# Patient Record
Sex: Female | Born: 1975 | Race: White | Hispanic: No | Marital: Married | Smoking: Never smoker
Health system: Southern US, Community
[De-identification: ages and names within clinical notes are randomized; demographics above are authoritative.]

## PROBLEM LIST (undated history)

## (undated) DIAGNOSIS — F419 Anxiety disorder, unspecified: Secondary | ICD-10-CM

---
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
## 1998-05-31 ENCOUNTER — Ambulatory Visit (HOSPITAL_COMMUNITY): Admission: RE | Admit: 1998-05-31 | Discharge: 1998-05-31 | Payer: Self-pay | Admitting: Internal Medicine

## 1998-05-31 ENCOUNTER — Encounter: Payer: Self-pay | Admitting: Internal Medicine

## 2003-08-14 ENCOUNTER — Emergency Department (HOSPITAL_COMMUNITY): Admission: EM | Admit: 2003-08-14 | Discharge: 2003-08-14 | Payer: Self-pay | Admitting: Emergency Medicine

## 2007-12-21 ENCOUNTER — Other Ambulatory Visit: Admission: RE | Admit: 2007-12-21 | Discharge: 2007-12-21 | Payer: Self-pay | Admitting: Obstetrics and Gynecology

## 2011-10-17 ENCOUNTER — Other Ambulatory Visit (HOSPITAL_COMMUNITY)
Admission: RE | Admit: 2011-10-17 | Discharge: 2011-10-17 | Disposition: A | Discharge status: Home / Self Care | Payer: BC Managed Care – PPO | Source: Ambulatory Visit | Attending: Family Medicine | Admitting: Family Medicine

## 2011-10-17 ENCOUNTER — Other Ambulatory Visit: Payer: Self-pay | Admitting: Family Medicine

## 2011-10-17 DIAGNOSIS — R87810 Cervical high risk human papillomavirus (HPV) DNA test positive: Secondary | ICD-10-CM | POA: Insufficient documentation

## 2011-10-17 DIAGNOSIS — Z01419 Encounter for gynecological examination (general) (routine) without abnormal findings: Secondary | ICD-10-CM | POA: Insufficient documentation

## 2011-10-30 ENCOUNTER — Other Ambulatory Visit: Payer: Self-pay | Admitting: Family Medicine

## 2012-04-27 ENCOUNTER — Other Ambulatory Visit (HOSPITAL_COMMUNITY)
Admission: RE | Admit: 2012-04-27 | Discharge: 2012-04-27 | Disposition: A | Discharge status: Home / Self Care | Payer: BC Managed Care – PPO | Source: Ambulatory Visit | Attending: Family Medicine | Admitting: Family Medicine

## 2012-04-27 ENCOUNTER — Other Ambulatory Visit: Payer: Self-pay | Admitting: Family Medicine

## 2012-04-27 DIAGNOSIS — R87619 Unspecified abnormal cytological findings in specimens from cervix uteri: Secondary | ICD-10-CM | POA: Insufficient documentation

## 2012-11-13 ENCOUNTER — Other Ambulatory Visit (HOSPITAL_COMMUNITY)
Admission: RE | Admit: 2012-11-13 | Discharge: 2012-11-13 | Disposition: A | Discharge status: Home / Self Care | Payer: BC Managed Care – PPO | Source: Ambulatory Visit | Attending: Family Medicine | Admitting: Family Medicine

## 2012-11-13 ENCOUNTER — Other Ambulatory Visit: Payer: Self-pay | Admitting: Physician Assistant

## 2012-11-13 DIAGNOSIS — Z01419 Encounter for gynecological examination (general) (routine) without abnormal findings: Secondary | ICD-10-CM | POA: Insufficient documentation

## 2013-02-08 ENCOUNTER — Telehealth: Payer: Self-pay | Admitting: Nurse Practitioner

## 2013-02-09 NOTE — Telephone Encounter (Signed)
error 

## 2019-05-26 ENCOUNTER — Other Ambulatory Visit: Payer: Self-pay

## 2019-05-26 ENCOUNTER — Emergency Department (HOSPITAL_COMMUNITY)
Admission: EM | Admit: 2019-05-26 | Discharge: 2019-05-26 | Discharge status: Against Medical Advice | Payer: BC Managed Care – PPO | Attending: Emergency Medicine | Admitting: Emergency Medicine

## 2019-05-26 ENCOUNTER — Encounter (HOSPITAL_COMMUNITY): Payer: Self-pay | Admitting: Family Medicine

## 2019-05-26 DIAGNOSIS — R109 Unspecified abdominal pain: Secondary | ICD-10-CM | POA: Diagnosis present

## 2019-05-26 DIAGNOSIS — Z5321 Procedure and treatment not carried out due to patient leaving prior to being seen by health care provider: Secondary | ICD-10-CM | POA: Diagnosis not present

## 2019-05-26 LAB — COMPREHENSIVE METABOLIC PANEL
ALT: 29 U/L (ref 0–44)
AST: 23 U/L (ref 15–41)
Albumin: 4.6 g/dL (ref 3.5–5.0)
Alkaline Phosphatase: 78 U/L (ref 38–126)
Anion gap: 10 (ref 5–15)
BUN: 18 mg/dL (ref 6–20)
CO2: 24 mmol/L (ref 22–32)
Calcium: 9.5 mg/dL (ref 8.9–10.3)
Chloride: 104 mmol/L (ref 98–111)
Creatinine, Ser: 0.86 mg/dL (ref 0.44–1.00)
GFR calc Af Amer: >60 mL/min (ref >60)
GFR calc non Af Amer: >60 mL/min (ref >60)
Glucose, Bld: 124 mg/dL — ABNORMAL HIGH (ref 70–99)
Potassium: 4.2 mmol/L (ref 3.5–5.1)
Sodium: 138 mmol/L (ref 135–145)
Total Bilirubin: 0.2 mg/dL — ABNORMAL LOW (ref 0.3–1.2)
Total Protein: 7.6 g/dL (ref 6.5–8.1)

## 2019-05-26 LAB — LIPASE, BLOOD: Lipase: 37 U/L (ref 11–51)

## 2019-05-26 LAB — URINALYSIS, ROUTINE W REFLEX MICROSCOPIC
Bilirubin Urine: NEGATIVE
Glucose, UA: NEGATIVE mg/dL
Hgb urine dipstick: NEGATIVE
Ketones, ur: NEGATIVE mg/dL
Leukocytes,Ua: NEGATIVE
Nitrite: NEGATIVE
Protein, ur: NEGATIVE mg/dL
Specific Gravity, Urine: 1.02 (ref 1.005–1.030)
pH: 5 (ref 5.0–8.0)

## 2019-05-26 LAB — CBC
HCT: 45.5 % (ref 36.0–46.0)
Hemoglobin: 14.4 g/dL (ref 12.0–15.0)
MCH: 27.7 pg (ref 26.0–34.0)
MCHC: 31.6 g/dL (ref 30.0–36.0)
MCV: 87.7 fL (ref 80.0–100.0)
Platelets: 287 10*3/uL (ref 150–400)
RBC: 5.19 MIL/uL — ABNORMAL HIGH (ref 3.87–5.11)
RDW: 12.2 % (ref 11.5–15.5)
WBC: 10.9 10*3/uL — ABNORMAL HIGH (ref 4.0–10.5)
nRBC: 0 % (ref 0.0–0.2)

## 2019-05-26 LAB — I-STAT BETA HCG BLOOD, ED (MC, WL, AP ONLY): I-stat hCG, quantitative: <5 m[IU]/mL (ref <5)

## 2019-05-26 NOTE — ED Notes (Signed)
Patient informed registration that she was leaving due to the wait.

## 2019-10-12 ENCOUNTER — Ambulatory Visit: Payer: BC Managed Care – PPO | Admitting: Plastic Surgery

## 2019-10-12 ENCOUNTER — Encounter: Payer: Self-pay | Admitting: Plastic Surgery

## 2019-10-12 ENCOUNTER — Other Ambulatory Visit: Payer: Self-pay

## 2019-10-12 DIAGNOSIS — M542 Cervicalgia: Secondary | ICD-10-CM

## 2019-10-12 DIAGNOSIS — M549 Dorsalgia, unspecified: Secondary | ICD-10-CM | POA: Insufficient documentation

## 2019-10-12 DIAGNOSIS — N62 Hypertrophy of breast: Secondary | ICD-10-CM | POA: Diagnosis not present

## 2019-10-12 DIAGNOSIS — M546 Pain in thoracic spine: Secondary | ICD-10-CM | POA: Diagnosis not present

## 2019-10-12 DIAGNOSIS — G8929 Other chronic pain: Secondary | ICD-10-CM | POA: Diagnosis not present

## 2019-10-12 NOTE — Progress Notes (Signed)
Patient ID: Sierra Flores, female    DOB: 1976/08/10, 44 y.o.   MRN: [id number]   Chief Complaint  Patient presents with  . Breast Problem    Mammary Hyperplasia: The patient is a 44 y.o. female with a history of mammary hyperplasia for several years.  She has extremely large breasts causing symptoms that include the following: Back pain in the upper and lower back, including neck pain. She pulls or pins her bra straps to provide better lift and relief of the pressure and pain. She notices relief by holding her breast up manually.  Her shoulder straps cause grooves and pain and pressure that requires padding for relief. Pain medication is sometimes required with motrin and tylenol.  Activities that are hindered by enlarged breasts include: exercise and running. She has not been to PT yet but is willing.  She has been seen by her PCP for the neck and back pain. She is a social smoker of M and is willing to obstaine before surgery.  Her breasts are extremely large and fairly symmetric. The right is slightly larger than the left side. She has hyperpigmentation of the inframammary area on both sides.  The sternal to nipple distance on the right is 32 cm and the left is 31 cm.  The IMF distance is 18 cm.  She is 5 feet 5 inches tall and weighs 220 pounds.  Preoperative bra size = 42 DD/DDD cup.  The estimated excess breast tissue to be removed at the time of surgery = 750 grams on the left and 750 grams on the right.  Mammogram history: 2/20 and was negative.  She is planing to have this years mammogram in the next 1-2 weeks.    Review of Systems  Constitutional: Negative.  Negative for activity change.  HENT: Negative.   Eyes: Negative.   Respiratory: Negative.   Cardiovascular: Negative.  Negative for leg swelling.  Gastrointestinal: Negative.   Endocrine: Negative.   Genitourinary: Negative.   Musculoskeletal: Positive for back pain and neck pain.  Skin: Negative for color change and  wound.  Neurological: Negative.   Hematological: Negative.   Psychiatric/Behavioral: Negative.     History reviewed. No pertinent past medical history.  History reviewed. No pertinent surgical history.    Current Outpatient Medications:  .  escitalopram (LEXAPRO) 10 MG tablet, Take 10 mg by mouth daily., Disp: , Rfl:  .  rizatriptan (MAXALT) 10 MG tablet, Take 10 mg by mouth 2 (two) times daily as needed., Disp: , Rfl:    Objective:   Vitals:   10/12/19 1117  BP: 134/76  Pulse: 70  Temp: 98.2 F (36.8 C)  SpO2: 99%    Physical Exam Vitals and nursing note reviewed.  Constitutional:      Appearance: Normal appearance.  HENT:     Head: Normocephalic and atraumatic.  Cardiovascular:     Rate and Rhythm: Normal rate.  Pulmonary:     Effort: Pulmonary effort is normal. No respiratory distress.  Abdominal:     General: Abdomen is flat. There is no distension.  Skin:    General: Skin is warm.  Neurological:     General: No focal deficit present.     Mental Status: She is alert and oriented to person, place, and time.  Psychiatric:        Mood and Affect: Mood normal.        Behavior: Behavior normal.     Assessment & Plan:  Neck pain  Chronic bilateral thoracic back pain  Symptomatic mammary hypertrophy  Recommend bilateral breast reduction with possible liposuction.  Recommend PT for eval and treatment.  Will need updated mammogram.  Pictures were obtained of the patient and placed in the chart with the patient's or guardian's permission.   Peggye Form, DO   The 21st Century Cures Act was signed into law in 2016 which includes the topic of electronic health records.  This provides immediate access to information in MyChart.  This includes consultation notes, operative notes, office notes, lab results and pathology reports.  If you have any questions about what you read please let us know at your next visit or call us at the office.  We are right here  with you.

## 2019-10-26 ENCOUNTER — Ambulatory Visit: Payer: BC Managed Care – PPO | Attending: Plastic Surgery | Admitting: Physical Therapy

## 2019-10-26 ENCOUNTER — Other Ambulatory Visit: Payer: Self-pay

## 2019-10-26 ENCOUNTER — Encounter: Payer: Self-pay | Admitting: Physical Therapy

## 2019-10-26 DIAGNOSIS — M6281 Muscle weakness (generalized): Secondary | ICD-10-CM | POA: Diagnosis present

## 2019-10-26 DIAGNOSIS — M546 Pain in thoracic spine: Secondary | ICD-10-CM | POA: Diagnosis present

## 2019-10-26 DIAGNOSIS — R293 Abnormal posture: Secondary | ICD-10-CM | POA: Diagnosis present

## 2019-10-26 DIAGNOSIS — M542 Cervicalgia: Secondary | ICD-10-CM | POA: Diagnosis present

## 2019-10-26 DIAGNOSIS — M5441 Lumbago with sciatica, right side: Secondary | ICD-10-CM | POA: Diagnosis present

## 2019-10-26 NOTE — Patient Instructions (Addendum)
Access Code: [email or web address]  URL: [email or web address]  Date: 10/26/2019  Prepared by: Garen Lah   Exercises  Seated Piriformis Stretch - 10 reps - 3 sets - 1x daily - 7x weekly  Shoulder External Rotation and Scapular Retraction with Resistance - 10 reps - 3 sets - 2x daily - 7x weekly  Scapular Retraction with Resistance - 10 reps - 3 sets - 2x daily - 7x weekly  Scapular Retraction with Resistance Advanced - 10 reps - 3 sets - 2x daily - 7x weekly    Garen Lah, PT Certified Exercise Expert for the Aging Adult  10/26/19 11:36 AM Phone: [phone] Fax: [phone]

## 2019-10-26 NOTE — Therapy (Signed)
[facility] [address] [place], [place], [address] Phone: [phone]   Fax:  [phone]  Physical Therapy Evaluation  Patient Details  Name: Caileen Veracruz MRN: [id number] Date of Birth: [birth year] Referring Provider (PT): Foster Simpson MD   Encounter Date: 10/26/2019  PT End of Session - 10/26/19 1449    Visit Number  1    Number of Visits  12    Date for PT Re-Evaluation  12/07/19    Authorization Type  BCBS    PT Start Time  1100    PT Stop Time  1144    PT Time Calculation (min)  44 min    Activity Tolerance  Patient tolerated treatment well    Behavior During Therapy  [facility] for tasks assessed/performed       History reviewed. No pertinent past medical history.  History reviewed. No pertinent surgical history.  There were no vitals filed for this visit.   Subjective Assessment - 10/26/19 1109    Subjective  My pain is in my mid and low back. I work at [employer] at [employer].    Pertinent History  back and neck pain more recent.    How long can you sit comfortably?  30 min    Patient Stated Goals  Get stronger and not have back pain,    Currently in Pain?  Yes    Pain Score  7     Pain Location  Thoracic    Pain Orientation  Mid;Right;Left    Pain Descriptors / Indicators  Burning;Sharp    Pain Type  Chronic pain    Pain Onset  More than a month ago    Aggravating Factors   bending over, household chores, unloading dish washer, taking care of dogs    Multiple Pain Sites  Yes    Pain Score  7    Pain Location  Back    Pain Orientation  Right    Pain Descriptors / Indicators  Aching;Nagging    Pain Radiating Towards  Rt buttocks    Aggravating Factors   bending over to pick up items    Pain Score  0    Pain Location  Neck    Pain Orientation  Right;Left         [facility] PT Assessment - 10/26/19 0001      Assessment   Medical Diagnosis  thoracic and low back pain, neck pain ,  symptomatick breast hypertrophy    Referring Provider (PT)  Foster Simpson MD    Onset Date/Surgical Date  07/28/19   I have had pain last year but increasing last 3 months   Hand Dominance  Left    Next MD Visit  none scheduled    Prior Therapy  none      Precautions   Precautions  None      Restrictions   Weight Bearing Restrictions  No      Balance Screen   Has the patient fallen in the past 6 months  No    Has the patient had a decrease in activity level because of a fear of falling?   No    Is the patient reluctant to leave their home because of a fear of falling?   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     Cognition   Overall Cognitive Status  Within Functional Limits for tasks assessed      Observation/Other Assessments   Focus on Therapeutic Outcomes (FOTO)   FOTO intake for Thoracic intak 52% limitation 48% predicted 33%,  neck intake 58% limitation 42%l predicted 21%       Sensation   Light Touch  Appears Intact      Posture/Postural Control   Posture/Postural Control  Postural limitations    Postural Limitations  Rounded Shoulders;Forward head;Anterior pelvic tilt    Posture Comments  increased abdominal girth      ROM / Strength   AROM / PROM / Strength  AROM;Strength      AROM   Overall AROM   Deficits    Right Shoulder Flexion  160 Degrees    Right Shoulder ABduction  157 Degrees    Left Shoulder Flexion  160 Degrees    Left Shoulder ABduction  156 Degrees    Right Hip External Rotation   20   painful   Right Hip Internal Rotation   30    Left Hip External Rotation   46    Left  Hip Internal Rotation   35    Cervical Flexion  WNL    Cervical Extension  WNL    Cervical - Right Side Bend  25    Cervical - Left Side Bend  35    Cervical - Right Rotation  65    Cervical - Left Rotation  65    Lumbar Flexion  70   finger tips to ankles   Lumbar Extension  15    Lumbar - Right Side Bend  WNL    Lumbar - Left Side Bend  WNL    Lumbar - Right Rotation  WNL    Lumbar - Left Rotation  WNL      Strength   Overall Strength  Deficits    Right Shoulder Flexion  4+/5    Right Shoulder Extension  4+/5    Right Shoulder ABduction  4+/5    Right Shoulder Internal Rotation  4+/5    Right Shoulder External Rotation  4/5    Left Shoulder Flexion  4+/5    Left Shoulder Extension  4+/5    Left Shoulder ABduction  4+/5    Left Shoulder Internal Rotation  4+/5    Left Shoulder External Rotation  4/5    Right Hip Flexion  5/5    Right Hip Extension  5/5    Right Hip ABduction  4/5    Left Hip Flexion  5/5    Left Hip Extension  5/5    Left Hip ABduction  4/5    Right Knee Flexion  5/5    Right Knee Extension  5/5    Left Knee Flexion  5/5    Left Knee Extension  5/5      Flexibility   Soft Tissue Assessment /Muscle Length  yes    Hamstrings  RT and LT 65      Palpation   Palpation comment  tenderness over RT low back Lumbar/ QL/ RT hip gluteals /piriformis  and tenderness over RT/LT thoracic paraspinals, Tnederness over bil upper trap/ levator, mild tenderness over bil cervical paraspinals                Objective measurements completed on examination: See above findings.      [facility] Adult PT Treatment/Exercise - 10/26/19 0001      Lumbar Exercises: Stretches   Piriformis Stretch  3 reps;30 seconds    Piriformis Stretch Limitations  seated for RT LE      Shoulder Exercises: Standing   External Rotation  Strengthening;Both;10 reps;Theraband    Theraband Level (Shoulder External Rotation)  Level 2 (Red)    Extension  Strengthening;Both;10  reps;Theraband    Theraband Level (Shoulder Extension)  Level 2 (Red)    Row  Strengthening;Both;10 reps;Theraband    Theraband Level (Shoulder Row)  Level 2 (Red)               PT Short Term Goals - 10/26/19 1427      PT SHORT TERM GOAL #1   Title  STG=LTG        PT Long Term Goals - 10/26/19 1139      PT LONG TERM GOAL #1   Title  Pt will be independent with advanced HEP for neck, core and back/LE    Time  6    Period  Weeks    Status  New    Target Date  12/07/19      PT LONG TERM GOAL #2   Title  Pt will be able to bend forward to pick up items in order to care for dogs without exacerbating pain    Time  6    Period  Weeks    Status  New    Target Date  12/07/19      PT LONG TERM GOAL #3   Title  Pt will be able to set up work station for computer /seated work for full time job to encourage better posture for working tasks    Time  6    Period  Weeks    Status  New    Target Date  12/07/19      PT LONG TERM GOAL #4   Title  Pt will be able to demonstrate proper posture and pain management techniques in order to unload dish washer and perform other household chores    Time  6    Period  Weeks    Status  New    Target Date  12/07/19      PT LONG TERM GOAL #5   Title  FOTO will improve from  48% limitation of thoracic spine, 42% limiation of neck    to  to 33% limitation of Thoracic and 21% liimitaiton of neck   indicating improved functional mobility.    Time  6    Period  Weeks    Status  New    Target Date  12/07/19            Access Code: [email or web address]  URL: [email or web address]  Date: 10/26/2019  Prepared by: Garen Lah   Exercises  Seated Piriformis Stretch - 10 reps - 3 sets - 1x daily - 7x weekly  Shoulder External Rotation and Scapular Retraction with Resistance - 10 reps - 3 sets - 2x daily - 7x weekly  Scapular Retraction with Resistance - 10 reps - 3 sets - 2x daily - 7x weekly  Scapular Retraction with  Resistance Advanced - 10 reps - 3 sets - 2x  daily - 7x weekly   Plan - 10/26/19 1437    Clinical Impression Statement  44 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  she also has symptomatic breast hypertrophy and abnormal posture in which to compensate.  She is unable to bend down and care for dogs or work comfortably at full time job, and additional part time in retail. Household chores and increasing pain in last 3 months encouraged her to try to find relief.  Ms Liddicoat will benefit form skilled PT to address impairments in abnormal posture, decrease AROM, muscle weakness ,pain due to enlarged breast hypertrophy encouraging a more flexed posture.    Examination-Activity Limitations  Bend;Sit    Stability/Clinical Decision Making  Stable/Uncomplicated    Clinical Decision Making  Low    Rehab Potential  Good    PT Frequency  2x / week    PT Duration  6 weeks    PT Treatment/Interventions  Cryotherapy;Electrical Stimulation;Iontophoresis 4mg /ml Dexamethasone;Moist Heat;Traction;Ultrasound;Therapeutic exercise;Therapeutic activities;Neuromuscular re-education;Patient/family education;Passive range of motion;Manual techniques;Dry needling;Taping;Joint Manipulations;Spinal Manipulations    PT Next Visit Plan  reinforce HEP and thoracic. back/ neck strength    PT Home Exercise Plan  [email or web address]    Consulted and Agree with Plan of Care  Patient       Patient will benefit from skilled therapeutic intervention in order to improve the following deficits and impairments:  Pain, Improper body mechanics, Postural dysfunction, Decreased strength, Decreased range of motion, Decreased activity tolerance, Increased muscle spasms  Visit Diagnosis: Pain in thoracic spine  Acute right-sided low back pain with right-sided sciatica  Cervicalgia  Abnormal posture  Muscle weakness (generalized)     Problem List Patient Active Problem List    Diagnosis Date Noted  . Neck pain 10/12/2019  . Back pain 10/12/2019  . Symptomatic mammary hypertrophy 10/12/2019    Voncille Lo, PT Certified Exercise Expert for the Aging Adult  10/26/19 2:50 PM Phone: [phone] Fax: [facility] [address] [place], [place], [address] Phone: [phone]   Fax:  [phone]  Name: Etoy Mcdonnell MRN: [id number] Date of Birth: [birth year]

## 2019-11-04 ENCOUNTER — Ambulatory Visit: Payer: BC Managed Care – PPO | Admitting: Physical Therapy

## 2019-11-04 ENCOUNTER — Other Ambulatory Visit: Payer: Self-pay

## 2019-11-04 DIAGNOSIS — M5441 Lumbago with sciatica, right side: Secondary | ICD-10-CM

## 2019-11-04 DIAGNOSIS — R293 Abnormal posture: Secondary | ICD-10-CM

## 2019-11-04 DIAGNOSIS — M546 Pain in thoracic spine: Secondary | ICD-10-CM | POA: Diagnosis not present

## 2019-11-04 DIAGNOSIS — M6281 Muscle weakness (generalized): Secondary | ICD-10-CM

## 2019-11-04 DIAGNOSIS — M542 Cervicalgia: Secondary | ICD-10-CM

## 2019-11-04 NOTE — Therapy (Addendum)
[place], [place], [address] Phone: [phone]   Fax:  [phone]  Physical Therapy Treatment  Patient Details  Name: Sierra Flores MRN: [id number] Date of Birth: July 22, 1976 Referring Provider (PT): Audelia Hives MD   Encounter Date: 11/04/2019  PT End of Session - 11/04/19 1237    Visit Number  2    Number of Visits  12    Date for PT Re-Evaluation  12/07/19    Authorization Type  BCBS    PT Start Time  1230    PT Stop Time  1315    PT Time Calculation (min)  45 min       No past medical history on file.  No past surgical history on file.  There were no vitals filed for this visit.  Subjective Assessment - 11/04/19 1233    Subjective  No pain at rest . Right low back hurts when bending over. Also did not mention right heel burns with walking. Sometimes stiff ankle in the morning.    Currently in Pain?  No/denies    Pain Score  --   no pain at rest                      [facility] Adult PT Treatment/Exercise - 11/04/19 0001      Lumbar Exercises: Stretches   Piriformis Stretch  3 reps;30 seconds    Piriformis Stretch Limitations      push and pull   Gastroc Stretch  3 reps;30 seconds      Lumbar Exercises: Aerobic   Nustep  L4 UE/LE x 5 minutes       Lumbar Exercises: Supine   Clam  20 reps    Clam Limitations  green     Bridge  10 reps    Bridge with clamshell  20 reps    Bridge with [employer] Limitations  Green      Lumbar Exercises: Quadruped   Single Arm Raise  10 reps    Single Arm Raises Limitations  cues for spine alignement / chin tuck    Straight Leg Raise  10 reps    Opposite Arm/Leg Raise  10 reps      Shoulder Exercises: Standing   External Rotation  Strengthening;Both;10 reps;Theraband    Theraband Level (Shoulder External Rotation)  Level 2 (Red)    Extension  20 reps    Theraband Level (Shoulder Extension)  Level 2 (Red)    Row   Strengthening;Both;10 reps;Theraband    Theraband Level (Shoulder Row)  Level 2 (Red)      Neck Exercises: Stretches   Upper Trapezius Stretch  2 reps;20 seconds    Levator Stretch  2 reps;20 seconds    Other Neck Stretches  neck retraction x10              PT Education - 11/04/19 1426    Education Details  HEP    Person(s) Educated  Patient    Methods  Explanation;Handout    Comprehension  Verbalized understanding       PT Short Term Goals - 10/26/19 1427      PT SHORT TERM GOAL #1   Title  STG=LTG        PT Long Term Goals - 10/26/19 1139      PT LONG TERM GOAL #1   Title  Pt will be independent with advanced HEP for neck, core and back/LE    Time  6  Period  Weeks    Status  New    Target Date  12/07/19      PT LONG TERM GOAL #2   Title  Pt will be able to bend forward to pick up items in order to care for dogs without exacerbating pain    Time  6    Period  Weeks    Status  New    Target Date  12/07/19      PT LONG TERM GOAL #3   Title  Pt will be able to set up work station for computer /seated work for full time job to encourage better posture for working tasks    Time  6    Period  Weeks    Status  New    Target Date  12/07/19      PT LONG TERM GOAL #4   Title  Pt will be able to demonstrate proper posture and pain management techniques in order to unload dish washer and perform other household chores    Time  6    Period  Weeks    Status  New    Target Date  12/07/19      PT LONG TERM GOAL #5   Title  FOTO will improve from  48% limitation of thoracic spine, 42% limiation of neck    to  to 33% limitation of Thoracic and 21% liimitaiton of neck   indicating improved functional mobility.    Time  6    Period  Weeks    Status  New    Target Date  12/07/19            Plan - 11/04/19 1237    Clinical Impression Statement  Pt reports no pain with HEP and no pain at rest. She reports improvement in upper back and neck with HEP however  still has the same pain in right low back with bending over. Reviewed HEP and progressed with core and hip strength with cervical stabilization. Added claf stretch for ankle tightness. She had no pain during session.    PT Next Visit Plan  reinforce HEP and thoracic. back/ neck strength,    PT Home Exercise Plan  [email or web address] ( shoulder rows, extension, bilateral shoulder ER, seated figure 4) added bird dogs and bridge with clam       Patient will benefit from skilled therapeutic intervention in order to improve the following deficits and impairments:  Pain, Improper body mechanics, Postural dysfunction, Decreased strength, Decreased range of motion, Decreased activity tolerance, Increased muscle spasms  Visit Diagnosis: Pain in thoracic spine  Acute right-sided low back pain with right-sided sciatica  Cervicalgia  Abnormal posture  Muscle weakness (generalized)     Problem List Patient Active Problem List   Diagnosis Date Noted  . Neck pain 10/12/2019  . Back pain 10/12/2019  . Symptomatic mammary hypertrophy 10/12/2019    Sherrie Mustache, PTA 11/04/2019, 2:26 PM  [facility] [address] [place], [place], [address] Phone: [phone]   Fax:  [phone]  Name: Bobbiejo Ishikawa MRN: [id number] Date of Birth: November 21, 1975

## 2019-11-04 NOTE — Patient Instructions (Signed)
Access Code: [email or web address] URL: [email or web address] Date: 11/04/2019 Prepared by: Jannette Spanner  Exercises Bird Dog - 2 x daily - 7 x weekly - 2 sets - 10 reps Bridge with Hip Abduction and Resistance - 2 x daily - 7 x weekly - 2 sets - 10 reps Standing Gastroc Stretch - 2 x daily - 7 x weekly - 1 sets - 3 reps - 30 hold

## 2019-11-08 ENCOUNTER — Encounter: Payer: Self-pay | Admitting: Physical Therapy

## 2019-11-08 ENCOUNTER — Other Ambulatory Visit: Payer: Self-pay

## 2019-11-08 ENCOUNTER — Ambulatory Visit: Payer: BC Managed Care – PPO | Admitting: Physical Therapy

## 2019-11-08 DIAGNOSIS — M6281 Muscle weakness (generalized): Secondary | ICD-10-CM

## 2019-11-08 DIAGNOSIS — M5441 Lumbago with sciatica, right side: Secondary | ICD-10-CM

## 2019-11-08 DIAGNOSIS — M546 Pain in thoracic spine: Secondary | ICD-10-CM | POA: Diagnosis not present

## 2019-11-08 DIAGNOSIS — R293 Abnormal posture: Secondary | ICD-10-CM

## 2019-11-08 DIAGNOSIS — M542 Cervicalgia: Secondary | ICD-10-CM

## 2019-11-08 NOTE — Patient Instructions (Signed)
Standing Hip Hinge with Dowel - 2 x daily - 7 x weekly - 2 sets - 10 reps

## 2019-11-08 NOTE — Therapy (Signed)
[place], [place], [address] Phone: [phone]   Fax:  [phone]  Physical Therapy Treatment  Patient Details  Name: Sierra Flores MRN: [id number] Date of Birth: Jun 22, 1976 Referring Provider (PT): Audelia Hives MD   Encounter Date: 11/08/2019  PT End of Session - 11/08/19 1153    Visit Number  3    Number of Visits  12    Date for PT Re-Evaluation  12/07/19    Authorization Type  BCBS    PT Start Time  6222    PT Stop Time  1230    PT Time Calculation (min)  45 min       History reviewed. No pertinent past medical history.  History reviewed. No pertinent surgical history.  There were no vitals filed for this visit.  Subjective Assessment - 11/08/19 1151    Subjective  3/10 my back is bothering me. Heel is still burning with walking.    Currently in Pain?  Yes    Pain Score  3     Pain Location  Back    Pain Orientation  Right;Left;Mid    Pain Descriptors / Indicators  Burning    Pain Type  Chronic pain                       [facility] Adult PT Treatment/Exercise - 11/08/19 0001      Lumbar Exercises: Stretches   Piriformis Stretch  3 reps;30 seconds    Piriformis Stretch Limitations      push and pull   Other Lumbar Stretch Exercise  childs pose       Lumbar Exercises: Standing   Lifting  20 reps    Lifting Limitations  15# from 8 inch step     Other Standing Lumbar Exercises  hip hinging with dowel , multiple reps       Lumbar Exercises: Quadruped   Madcat/Old Horse  10 reps    Opposite Arm/Leg Raise  10 reps      Shoulder Exercises: Standing   External Rotation  Strengthening;Both;10 reps;Theraband    Theraband Level (Shoulder External Rotation)  Level 2 (Red)    Extension  20 reps    Theraband Level (Shoulder Extension)  Level 3 (Green)    Row  Strengthening;Both;10 reps;Theraband    Theraband Level (Shoulder Row)  Level 3 Nyoka Cowden)              PT Education - 11/08/19 1233    Education Details  HEP    Person(s) Educated  Patient    Methods  Explanation;Handout    Comprehension  Verbalized understanding       PT Short Term Goals - 10/26/19 1427      PT SHORT TERM GOAL #1   Title  STG=LTG        PT Long Term Goals - 10/26/19 1139      PT LONG TERM GOAL #1   Title  Pt will be independent with advanced HEP for neck, core and back/LE    Time  6    Period  Weeks    Status  New    Target Date  12/07/19      PT LONG TERM GOAL #2   Title  Pt will be able to bend forward to pick up items in order to care for dogs without exacerbating pain    Time  6    Period  Weeks    Status  New  Target Date  12/07/19      PT LONG TERM GOAL #3   Title  Pt will be able to set up work station for computer /seated work for full time job to encourage better posture for working tasks    Time  6    Period  Weeks    Status  New    Target Date  12/07/19      PT LONG TERM GOAL #4   Title  Pt will be able to demonstrate proper posture and pain management techniques in order to unload dish washer and perform other household chores    Time  6    Period  Weeks    Status  New    Target Date  12/07/19      PT LONG TERM GOAL #5   Title  FOTO will improve from  48% limitation of thoracic spine, 42% limiation of neck    to  to 33% limitation of Thoracic and 21% liimitaiton of neck   indicating improved functional mobility.    Time  6    Period  Weeks    Status  New    Target Date  12/07/19            Plan - 11/08/19 1232    Clinical Impression Statement  Pt reports no increased pain or soreness after session. Her pain is in her lower back mostly and is exacerbated with lifting and bending activities. Reviewed HEP and began hip hinge/ dead lift training. She needed constant verbal and tactile cues with wooden dowel to perform proper hip hinge. Added hip hinge with dowel to HEP. She did not have pain during session  today.,    PT Next Visit Plan  reinforce HEP and thoracic. back/ neck strength, continue lifting progression  as tolerated , reinforce hip hinge form    PT Home Exercise Plan  [id number] ( shoulder rows, extension, bilateral shoulder ER, seated figure 4) added bird dogs and bridge with clam, added hip hinge with dowel       Patient will benefit from skilled therapeutic intervention in order to improve the following deficits and impairments:  Pain, Improper body mechanics, Postural dysfunction, Decreased strength, Decreased range of motion, Decreased activity tolerance, Increased muscle spasms  Visit Diagnosis: Pain in thoracic spine  Acute right-sided low back pain with right-sided sciatica  Cervicalgia  Abnormal posture  Muscle weakness (generalized)     Problem List Patient Active Problem List   Diagnosis Date Noted  . Neck pain 10/12/2019  . Back pain 10/12/2019  . 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 [place], [place], [address] Phone: [phone]   Fax:  [phone]  Name: Sierra Flores MRN: [id number] Date of Birth: 07-12-1976

## 2019-11-10 ENCOUNTER — Encounter: Payer: Self-pay | Admitting: Physical Therapy

## 2019-11-10 ENCOUNTER — Other Ambulatory Visit: Payer: Self-pay

## 2019-11-10 ENCOUNTER — Ambulatory Visit: Payer: BC Managed Care – PPO | Admitting: Physical Therapy

## 2019-11-10 DIAGNOSIS — R293 Abnormal posture: Secondary | ICD-10-CM

## 2019-11-10 DIAGNOSIS — M6281 Muscle weakness (generalized): Secondary | ICD-10-CM

## 2019-11-10 DIAGNOSIS — M546 Pain in thoracic spine: Secondary | ICD-10-CM

## 2019-11-10 DIAGNOSIS — M542 Cervicalgia: Secondary | ICD-10-CM

## 2019-11-10 DIAGNOSIS — M5441 Lumbago with sciatica, right side: Secondary | ICD-10-CM

## 2019-11-10 NOTE — Therapy (Addendum)
[place], [place], [address] Phone: [phone]   Fax:  [phone]  Physical Therapy Treatment/Discharge Note  Patient Details  Name: Sierra Flores MRN: [id number] Date of Birth: [birth year] Referring Provider (PT): Audelia Hives MD   Encounter Date: 11/10/2019  PT End of Session - 11/10/19 1158    Visit Number  4    Number of Visits  12    Date for PT Re-Evaluation  12/07/19    Authorization Type  BCBS    PT Start Time  1155    PT Stop Time  1233    PT Time Calculation (min)  38 min       History reviewed. No pertinent past medical history.  History reviewed. No pertinent surgical history.  There were no vitals filed for this visit.  Subjective Assessment - 11/10/19 1157    Subjective  Burning at heel with walking that comes and goes.    Currently in Pain?  Yes    Pain Score  3     Pain Location  Back    Pain Orientation  Mid;Right    Pain Descriptors / Indicators  Burning    Aggravating Factors   bending four household chores, picking up dogs    Pain Relieving Factors  good body mechanics    Pain Score  0    Pain Location  Back    Pain Orientation  Upper    Pain Score  0    Pain Location  Neck                       [facility] Adult PT Treatment/Exercise - 11/10/19 0001      Lumbar Exercises: Stretches   Other Lumbar Stretch Exercise  childs pose , added lateral stretch       Lumbar Exercises: Standing   Lifting  20 reps   x 2    Lifting Weights (lbs)  15, 20      Lumbar Exercises: Supine   Bent Knee Raise Limitations  90/90 ab bracing wiith table top holds 10 sec, then with toe taps     Bridge  10 reps    Bridge with clamshell  20 reps    Bridge with [employer] Limitations  Green      Lumbar Exercises: Quadruped   Opposite Arm/Leg Raise  10 reps    Opposite Arm/Leg Raise Limitations  cues for chin tuck- reported some left lumbar tension      Shoulder  Exercises: Standing   External Rotation  Strengthening;Both;10 reps;Theraband    Theraband Level (Shoulder External Rotation)  Level 3 (Green)    Extension  20 reps    Theraband Level (Shoulder Extension)  Level 3 (Green)    Row  Strengthening;Both;10 reps;Theraband    Theraband Level (Shoulder Row)  Level 3 Nyoka Cowden)               PT Short Term Goals - 10/26/19 1427      PT SHORT TERM GOAL #1   Title  STG=LTG        PT Long Term Goals - 10/26/19 1139      PT LONG TERM GOAL #1   Title  Pt will be independent with advanced HEP for neck, core and back/LE    Time  6    Period  Weeks    Status  New    Target Date  12/07/19      PT LONG TERM  GOAL #2   Title  Pt will be able to bend forward to pick up items in order to care for dogs without exacerbating pain    Time  6    Period  Weeks    Status  New    Target Date  12/07/19      PT LONG TERM GOAL #3   Title  Pt will be able to set up work station for computer /seated work for full time job to encourage better posture for working tasks    Time  6    Period  Weeks    Status  New    Target Date  12/07/19      PT LONG TERM GOAL #4   Title  Pt will be able to demonstrate proper posture and pain management techniques in order to unload dish washer and perform other household chores    Time  6    Period  Weeks    Status  New    Target Date  12/07/19      PT LONG TERM GOAL #5   Title  FOTO will improve from  48% limitation of thoracic spine, 42% limiation of neck    to  to 33% limitation of Thoracic and 21% liimitaiton of neck   indicating improved functional mobility.    Time  6    Period  Weeks    Status  New    Target Date  12/07/19            Plan - 11/10/19 1232    Clinical Impression Statement  Pt reports her ankle burning is still intermittent with walking. No neck or upper back recently however still low level back pain with bending for ADLS. Her technique with hip hinge/ dead lift is much improved  without cues today. Able to progress with 20# (2 10# dumbells). She had some increased tightness in lumbar during bird dogs today. Able to add lower abdominal strength to HEP without increased pain. No increased pain at end of session.    PT Next Visit Plan  PT : assess burning in heel? reinforce HEP and thoracic. back/ neck strength, continue lifting progression  as tolerated , reinforce hip hinge form, review 90/90 ab bracing    PT Home Exercise Plan  [id number] ( shoulder rows, extension, bilateral shoulder ER, seated figure 4) added bird dogs and bridge with clam, added hip hinge with dowel/ added 90/90 ab bracing.       Patient will benefit from skilled therapeutic intervention in order to improve the following deficits and impairments:  Pain, Improper body mechanics, Postural dysfunction, Decreased strength, Decreased range of motion, Decreased activity tolerance, Increased muscle spasms  Visit Diagnosis: Pain in thoracic spine  Acute right-sided low back pain with right-sided sciatica  Cervicalgia  Abnormal posture  Muscle weakness (generalized)     Problem List Patient Active Problem List   Diagnosis Date Noted  . Neck pain 10/12/2019  . Back pain 10/12/2019  . 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 Patient is being discharged due to the patient's request.  ?????    Pt requested cancellations of all appt due to  going to a different facility  Voncille Lo, PT Certified Exercise Expert for the Aging Adult  11/18/19 10:39 AM Phone: [phone] Fax: [phone]

## 2019-11-16 ENCOUNTER — Ambulatory Visit: Payer: BC Managed Care – PPO | Admitting: Physical Therapy

## 2019-11-18 ENCOUNTER — Ambulatory Visit: Payer: BC Managed Care – PPO | Admitting: Physical Therapy

## 2019-11-23 ENCOUNTER — Encounter: Payer: BC Managed Care – PPO | Admitting: Physical Therapy

## 2019-11-26 ENCOUNTER — Encounter: Payer: BC Managed Care – PPO | Admitting: Physical Therapy

## 2019-11-30 ENCOUNTER — Encounter: Payer: BC Managed Care – PPO | Admitting: Physical Therapy

## 2019-12-02 ENCOUNTER — Encounter: Payer: BC Managed Care – PPO | Admitting: Physical Therapy

## 2019-12-07 ENCOUNTER — Encounter: Payer: BC Managed Care – PPO | Admitting: Physical Therapy

## 2019-12-09 ENCOUNTER — Encounter: Payer: BC Managed Care – PPO | Admitting: Physical Therapy

## 2020-01-27 ENCOUNTER — Institutional Professional Consult (permissible substitution): Payer: BC Managed Care – PPO | Admitting: Plastic Surgery

## 2020-02-02 ENCOUNTER — Encounter: Payer: Self-pay | Admitting: Plastic Surgery

## 2020-02-02 ENCOUNTER — Other Ambulatory Visit: Payer: Self-pay

## 2020-02-02 ENCOUNTER — Ambulatory Visit (INDEPENDENT_AMBULATORY_CARE_PROVIDER_SITE_OTHER): Payer: BC Managed Care – PPO | Admitting: Plastic Surgery

## 2020-02-02 VITALS — BP 132/84 | HR 79 | Ht 66.0 in | Wt 219.6 lb

## 2020-02-02 DIAGNOSIS — M546 Pain in thoracic spine: Secondary | ICD-10-CM

## 2020-02-02 DIAGNOSIS — N62 Hypertrophy of breast: Secondary | ICD-10-CM | POA: Diagnosis not present

## 2020-02-02 DIAGNOSIS — M545 Low back pain, unspecified: Secondary | ICD-10-CM

## 2020-02-02 DIAGNOSIS — M4004 Postural kyphosis, thoracic region: Secondary | ICD-10-CM

## 2020-02-02 NOTE — Progress Notes (Signed)
   Referring Provider Laurann Montana, MD [phone] [place],  [place] [address]   CC:  Chief Complaint  Patient presents with  . Consult      Sierra Flores is an 44 y.o. female.  HPI: Patient presents to discuss breast reduction.  She is had years of back pain, neck pain, shoulder grooving.  She has a rashes beneath her breast.  She has gone to physical therapy and that did not relieve her symptoms.  She had a maternal grandmother with breast cancer.  She is currently a 54 double D and wants to be around a C cup.  She does not smoke cigarettes, is not a diabetic, and is not planning on having anymore children.  For her pain she has tried warm packs, cold packs, over-the-counter medication with little relief.  No Known Allergies  Outpatient Encounter Medications as of 02/02/2020  Medication Sig  . escitalopram (LEXAPRO) 10 MG tablet Take 10 mg by mouth daily.  . rizatriptan (MAXALT) 10 MG tablet Take 10 mg by mouth 2 (two) times daily as needed.   No facility-administered encounter medications on file as of 02/02/2020.     No past medical history on file.  No past surgical history on file.  No family history on file.  Social History   Social History Narrative  . Not on file     Review of Systems General: Denies fevers, chills, weight loss CV: Denies chest pain, shortness of breath, palpitations  Physical Exam Vitals with BMI 02/02/2020 10/12/2019 05/26/2019  Height 5\' 6"  5\' 5"  5\' 3"   Weight 219 lbs 10 oz 220 lbs 10 oz 211 lbs  BMI 35.46 36.71 37.39  Systolic 132 134  Diastolic 84 76 106  Pulse 79 70 74    General:  No acute distress,  Alert and oriented, Non-Toxic, Normal speech and affect Breast: She has grade 3 ptosis.  Sternal notch to nipple is 33 cm bilaterally.  Nipple to fold is 19 cm bilaterally.  I do not see any obvious scars or masses.  Assessment/Plan The patient has bilateral symptomatic macromastia.  She is a good candidate for a  breast reduction.  She is interested in pursuing surgical treatment.  The details of breast reduction surgery were discussed.  I explained the procedure in detail along the with the expected scars.  The risks were discussed in detail and include bleeding, infection, damage to surrounding structures, need for additional procedures, nipple loss, change in nipple sensation, persistent pain, contour irregularities and asymmetries.  I explained that breast feeding is often not possible after breast reduction surgery.  We discussed the expected postoperative course with an overall recovery period of about 1 month.  She demonstrated full understanding of all risks.  We discussed her personal risk factors.  I anticipate approximately 850 g of tissue removed from each side.   02/02/2020, 4:21 PM

## 2020-04-06 ENCOUNTER — Encounter: Payer: Self-pay | Admitting: Surgical

## 2020-04-06 ENCOUNTER — Other Ambulatory Visit: Payer: Self-pay

## 2020-04-06 ENCOUNTER — Ambulatory Visit (INDEPENDENT_AMBULATORY_CARE_PROVIDER_SITE_OTHER): Payer: BC Managed Care – PPO | Admitting: Surgical

## 2020-04-06 VITALS — BP 132/95 | HR 78 | Temp 98.5°F | Ht 65.0 in | Wt 217.6 lb

## 2020-04-06 DIAGNOSIS — N62 Hypertrophy of breast: Secondary | ICD-10-CM

## 2020-04-06 MED ORDER — ONDANSETRON HCL 4 MG PO TABS: 4.0000 mg | ORAL_TABLET | Freq: Three times a day (TID) | ORAL | 0 refills | Status: DC | PRN

## 2020-04-06 MED ORDER — HYDROCODONE-ACETAMINOPHEN 5-325 MG PO TABS: 1.0000 | ORAL_TABLET | Freq: Four times a day (QID) | ORAL | 0 refills | Status: AC | PRN

## 2020-04-06 NOTE — H&P (View-Only) (Signed)
Patient ID: Sierra Flores, female    DOB: April 15, 1976, 44 y.o.   MRN: [id number]  Chief Complaint  Patient presents with  . Pre-op Exam      ICD-10-CM   1. Macromastia  N62     History of Present Illness: Sierra Flores is a 44 y.o.  female  with a history of macromastia.  She presents for preoperative evaluation for upcoming procedure, bilateral breast reduction, scheduled for 04/17/20 with Dr. Arita Miss  Patient has no previous surgical history.  She has no known knowledge of any family member that has had surgery either. No history of DVT/PE.  No family history of DVT/PE.  No family or personal history of bleeding or clotting disorders.  Patient is not currently taking any blood thinners.  Patient has no history of cardiac or pulmonary diseases.  She does have a history of migraines for which she takes medication and this is well controlled.  Patient reports she has been feeling well lately.  Summary of Previous Visit: maternal grandmother with breast cancer, currently a 44 DD, wants to be around a C cup. Does not smoke cigarettes, not a diabetic. Not planning on having anymore children. 850 grams of tissue to be removed from each side.  Patient reports she had a mammogram recently, this was negative.  She is going to fax the results to [place].  Patient reports she also has a few skin tags that she has had for quite some time under her right breast, she is interested in having these excised.  She would like to know if this could be done at the same time.  Past Medical History: Allergies: No Known Allergies  Current Medications:  Current Outpatient Medications:  .  escitalopram (LEXAPRO) 10 MG tablet, Take 10 mg by mouth daily., Disp: , Rfl:  .  rizatriptan (MAXALT) 10 MG tablet, Take 10 mg by mouth 2 (two) times daily as needed., Disp: , Rfl:  .  HYDROcodone-acetaminophen (NORCO) 5-325 MG tablet, Take 1 tablet by mouth every 6 (six) hours as needed for up to 5 days for severe pain.,  Disp: 20 tablet, Rfl: 0 .  ondansetron (ZOFRAN) 4 MG tablet, Take 1 tablet (4 mg total) by mouth every 8 (eight) hours as needed for nausea or vomiting., Disp: 20 tablet, Rfl: 0  Past Medical Problems: History reviewed. No pertinent past medical history.  Past Surgical History: History reviewed. No pertinent surgical history.  Social History: Social History   Socioeconomic History  . Marital status: Married    Spouse name: Not on file  . Number of children: Not on file  . Years of education: Not on file  . Highest education level: Not on file  Occupational History  . Not on file  Tobacco Use  . Smoking status: Never Smoker  . Smokeless tobacco: Never Used  Vaping Use  . Vaping Use: Never used  Substance and Sexual Activity  . Alcohol use: Yes    Comment: 1-2 a week   . Drug use: Yes    Types: Marijuana    Comment: 1-2 times aweek   . Sexual activity: Not on file  Other Topics Concern  . Not on file  Social History Narrative  . Not on file   Social Determinants of Health   Financial Resource Strain:   . Difficulty of Paying Living Expenses: Not on file  Food Insecurity:   . Worried About Programme researcher, broadcasting/film/video in the Last Year: Not on file  .  Ran Out of Food in the Last Year: Not on file  Transportation Needs:   . Lack of Transportation (Medical): Not on file  . Lack of Transportation (Non-Medical): Not on file  Physical Activity:   . Days of Exercise per Week: Not on file  . Minutes of Exercise per Session: Not on file  Stress:   . Feeling of Stress : Not on file  Social Connections:   . Frequency of Communication with Friends and Family: Not on file  . Frequency of Social Gatherings with Friends and Family: Not on file  . Attends Religious Services: Not on file  . Active Member of Clubs or Organizations: Not on file  . Attends Banker Meetings: Not on file  . Marital Status: Not on file  Intimate Partner Violence:   . Fear of Current or  Ex-Partner: Not on file  . Emotionally Abused: Not on file  . Physically Abused: Not on file  . Sexually Abused: Not on file    Family History: History reviewed. No pertinent family history.  Review of Systems: Review of Systems  Constitutional: Negative.   Respiratory: Negative.   Cardiovascular: Negative.   Gastrointestinal: Negative.     Physical Exam: Vital Signs BP (!) 132/95 (BP Location: Left Wrist, Patient Position: Sitting, Cuff Size: Normal)   Pulse 78   Temp 98.5 F (36.9 C) (Oral)   Ht 5\' 5"  (1.651 m)   Wt 217 lb 10.1 oz (98.7 kg)   SpO2 99%   BMI 36.22 kg/m  Physical Exam Exam conducted with a chaperone present.  Constitutional:      General: She is not in acute distress.    Appearance: Normal appearance. She is not ill-appearing.  HENT:     Head: Normocephalic and atraumatic.  Eyes:     Pupils: Pupils are equal, round Neck:     Musculoskeletal: Normal range of motion.  Cardiovascular:     Rate and Rhythm: Normal rate and regular rhythm.     Pulses: Normal pulses.     Heart sounds: Normal heart sounds. No murmur.  Pulmonary:     Effort: Pulmonary effort is normal. No respiratory distress.     Breath sounds: Normal breath sounds. No wheezing.  Chest: Skin tags noted under right breast.  No rash noted. Abdominal:     General: Abdomen is flat. There is no distension.     Palpations: Abdomen is soft.     Tenderness: There is no abdominal tenderness.  Musculoskeletal: Normal range of motion.  Skin:    General: Skin is warm and dry.     Findings: No erythema or rash.  Neurological:     General: No focal deficit present.     Mental Status: She is alert and oriented to person, place, and time. Mental status is at baseline.     Motor: No weakness.  Psychiatric:        Mood and Affect: Mood normal.        Behavior: Behavior normal.     Assessment/Plan: The patient is scheduled for bilateral breast reduction with Dr. .  Risks, benefits, and  alternatives of procedure discussed, questions answered and consent obtained.    Smoking Status: Non-smoker; Counseling Given?  N/A Last Mammogram: Within the last year; Results: Negative per patient.  Patient to send results via fax.  Caprini Score: 4, moderate; Risk Factors include: Age, BMI greater than 25, and length of planned surgery. Recommendation for mechanical prophylaxis during surgery. Encourage early ambulation.  Pictures obtained: at consult, 02/02/20 and 10/12/19  Post-op Rx sent to pharmacy: Norco, zofran  Patient was provided with the breast reduction and General Surgical Risk consent document and Pain Medication Agreement prior to their appointment.  They had adequate time to read through the risk consent documents and Pain Medication Agreement. We also discussed them in person together during this preop appointment. All of their questions were answered to their satisfaction.  Recommended calling if they have any further questions.  Risk consent form and Pain Medication Agreement to be scanned into patient's chart.  I had a thorough discussion with the patient about postoperative expectations.  We discussed the postoperative protocol.  We also discussed the estimated amount of breast tissue to be removed and the estimated breast size postoperatively.  I discussed with the patient that there is no guaranteed postoperative breast size and cup size can also largely be dependent upon bra manufacturing.  I discussed with patient that the goal is to remove excess tissue as well as create a more proportional breast for her size.  I did also discussed with her that the goal would be a C cup.  We discussed that with aging and with time breast size and shape can change.  The risk that can be encountered with breast reduction were discussed and include the following but not limited to these:  Breast asymmetry, fluid accumulation, firmness of the breast, inability to breast feed, loss of nipple  or areola, skin loss, decrease or no nipple sensation, fat necrosis of the breast tissue, bleeding, infection, healing delay.  There are risks of anesthesia, changes to skin sensation and injury to nerves or blood vessels.  The muscle can be temporarily or permanently injured.  You may have an allergic reaction to tape, suture, glue, blood products which can result in skin discoloration, swelling, pain, skin lesions, poor healing.  Any of these can lead to the need for revisonal surgery or stage procedures. Nipple or breast piercing can increase risks of infection.  This procedure is best done when the breast is fully developed.  Changes in the breast will continue to occur over time.  Pregnancy can alter the outcomes of previous breast reduction surgery, weight gain and weigh loss can also effect the long term appearance.    Electronically signed by: Kermit Balo Carlotta Telfair, PA-C 04/06/2020 1:56 PM

## 2020-04-06 NOTE — Progress Notes (Signed)
   Patient ID: Sierra Flores, female    DOB: 08/28/1975, 44 y.o.   MRN: [id number]  Chief Complaint  Patient presents with  . Pre-op Exam      ICD-10-CM   1. Macromastia  N62     History of Present Illness: Sierra Flores is a 44 y.o.  female  with a history of macromastia.  She presents for preoperative evaluation for upcoming procedure, bilateral breast reduction, scheduled for 04/17/20 with Dr. Pace  Patient has no previous surgical history.  She has no known knowledge of any family member that has had surgery either. No history of DVT/PE.  No family history of DVT/PE.  No family or personal history of bleeding or clotting disorders.  Patient is not currently taking any blood thinners.  Patient has no history of cardiac or pulmonary diseases.  She does have a history of migraines for which she takes medication and this is well controlled.  Patient reports she has been feeling well lately.  Summary of Previous Visit: maternal grandmother with breast cancer, currently a 42 DD, wants to be around a C cup. Does not smoke cigarettes, not a diabetic. Not planning on having anymore children. 850 grams of tissue to be removed from each side.  Patient reports she had a mammogram recently, this was negative.  She is going to fax the results to us.  Patient reports she also has a few skin tags that she has had for quite some time under her right breast, she is interested in having these excised.  She would like to know if this could be done at the same time.  Past Medical History: Allergies: No Known Allergies  Current Medications:  Current Outpatient Medications:  .  escitalopram (LEXAPRO) 10 MG tablet, Take 10 mg by mouth daily., Disp: , Rfl:  .  rizatriptan (MAXALT) 10 MG tablet, Take 10 mg by mouth 2 (two) times daily as needed., Disp: , Rfl:  .  HYDROcodone-acetaminophen (NORCO) 5-325 MG tablet, Take 1 tablet by mouth every 6 (six) hours as needed for up to 5 days for severe pain.,  Disp: 20 tablet, Rfl: 0 .  ondansetron (ZOFRAN) 4 MG tablet, Take 1 tablet (4 mg total) by mouth every 8 (eight) hours as needed for nausea or vomiting., Disp: 20 tablet, Rfl: 0  Past Medical Problems: History reviewed. No pertinent past medical history.  Past Surgical History: History reviewed. No pertinent surgical history.  Social History: Social History   Socioeconomic History  . Marital status: Married    Spouse name: Not on file  . Number of children: Not on file  . Years of education: Not on file  . Highest education level: Not on file  Occupational History  . Not on file  Tobacco Use  . Smoking status: Never Smoker  . Smokeless tobacco: Never Used  Vaping Use  . Vaping Use: Never used  Substance and Sexual Activity  . Alcohol use: Yes    Comment: 1-2 a week   . Drug use: Yes    Types: Marijuana    Comment: 1-2 times aweek   . Sexual activity: Not on file  Other Topics Concern  . Not on file  Social History Narrative  . Not on file   Social Determinants of Health   Financial Resource Strain:   . Difficulty of Paying Living Expenses: Not on file  Food Insecurity:   . Worried About Running Out of Food in the Last Year: Not on file  .   Ran Out of Food in the Last Year: Not on file  Transportation Needs:   . Lack of Transportation (Medical): Not on file  . Lack of Transportation (Non-Medical): Not on file  Physical Activity:   . Days of Exercise per Week: Not on file  . Minutes of Exercise per Session: Not on file  Stress:   . Feeling of Stress : Not on file  Social Connections:   . Frequency of Communication with Friends and Family: Not on file  . Frequency of Social Gatherings with Friends and Family: Not on file  . Attends Religious Services: Not on file  . Active Member of Clubs or Organizations: Not on file  . Attends Banker Meetings: Not on file  . Marital Status: Not on file  Intimate Partner Violence:   . Fear of Current or  Ex-Partner: Not on file  . Emotionally Abused: Not on file  . Physically Abused: Not on file  . Sexually Abused: Not on file    Family History: History reviewed. No pertinent family history.  Review of Systems: Review of Systems  Constitutional: Negative.   Respiratory: Negative.   Cardiovascular: Negative.   Gastrointestinal: Negative.     Physical Exam: Vital Signs BP (!) 132/95 (BP Location: Left Wrist, Patient Position: Sitting, Cuff Size: Normal)   Pulse 78   Temp 98.5 F (36.9 C) (Oral)   Ht 5\' 5"  (1.651 m)   Wt 217 lb 10.1 oz (98.7 kg)   SpO2 99%   BMI 36.22 kg/m  Physical Exam Exam conducted with a chaperone present.  Constitutional:      General: She is not in acute distress.    Appearance: Normal appearance. She is not ill-appearing.  HENT:     Head: Normocephalic and atraumatic.  Eyes:     Pupils: Pupils are equal, round Neck:     Musculoskeletal: Normal range of motion.  Cardiovascular:     Rate and Rhythm: Normal rate and regular rhythm.     Pulses: Normal pulses.     Heart sounds: Normal heart sounds. No murmur.  Pulmonary:     Effort: Pulmonary effort is normal. No respiratory distress.     Breath sounds: Normal breath sounds. No wheezing.  Chest: Skin tags noted under right breast.  No rash noted. Abdominal:     General: Abdomen is flat. There is no distension.     Palpations: Abdomen is soft.     Tenderness: There is no abdominal tenderness.  Musculoskeletal: Normal range of motion.  Skin:    General: Skin is warm and dry.     Findings: No erythema or rash.  Neurological:     General: No focal deficit present.     Mental Status: She is alert and oriented to person, place, and time. Mental status is at baseline.     Motor: No weakness.  Psychiatric:        Mood and Affect: Mood normal.        Behavior: Behavior normal.     Assessment/Plan: The patient is scheduled for bilateral breast reduction with Dr. .  Risks, benefits, and  alternatives of procedure discussed, questions answered and consent obtained.    Smoking Status: Non-smoker; Counseling Given?  N/A Last Mammogram: Within the last year; Results: Negative per patient.  Patient to send results via fax.  Caprini Score: 4, moderate; Risk Factors include: Age, BMI greater than 25, and length of planned surgery. Recommendation for mechanical prophylaxis during surgery. Encourage early ambulation.  Pictures obtained: at consult, 02/02/20 and 10/12/19  Post-op Rx sent to pharmacy: Norco, zofran  Patient was provided with the breast reduction and General Surgical Risk consent document and Pain Medication Agreement prior to their appointment.  They had adequate time to read through the risk consent documents and Pain Medication Agreement. We also discussed them in person together during this preop appointment. All of their questions were answered to their satisfaction.  Recommended calling if they have any further questions.  Risk consent form and Pain Medication Agreement to be scanned into patient's chart.  I had a thorough discussion with the patient about postoperative expectations.  We discussed the postoperative protocol.  We also discussed the estimated amount of breast tissue to be removed and the estimated breast size postoperatively.  I discussed with the patient that there is no guaranteed postoperative breast size and cup size can also largely be dependent upon bra manufacturing.  I discussed with patient that the goal is to remove excess tissue as well as create a more proportional breast for her size.  I did also discussed with her that the goal would be a C cup.  We discussed that with aging and with time breast size and shape can change.  The risk that can be encountered with breast reduction were discussed and include the following but not limited to these:  Breast asymmetry, fluid accumulation, firmness of the breast, inability to breast feed, loss of nipple  or areola, skin loss, decrease or no nipple sensation, fat necrosis of the breast tissue, bleeding, infection, healing delay.  There are risks of anesthesia, changes to skin sensation and injury to nerves or blood vessels.  The muscle can be temporarily or permanently injured.  You may have an allergic reaction to tape, suture, glue, blood products which can result in skin discoloration, swelling, pain, skin lesions, poor healing.  Any of these can lead to the need for revisonal surgery or stage procedures. Nipple or breast piercing can increase risks of infection.  This procedure is best done when the breast is fully developed.  Changes in the breast will continue to occur over time.  Pregnancy can alter the outcomes of previous breast reduction surgery, weight gain and weigh loss can also effect the long term appearance.    Electronically signed by: Kermit Balo Lynnsey Barbara, PA-C 04/06/2020 1:56 PM

## 2020-04-07 ENCOUNTER — Encounter (HOSPITAL_BASED_OUTPATIENT_CLINIC_OR_DEPARTMENT_OTHER): Payer: Self-pay | Admitting: Plastic Surgery

## 2020-04-07 ENCOUNTER — Other Ambulatory Visit: Payer: Self-pay

## 2020-04-14 ENCOUNTER — Other Ambulatory Visit (HOSPITAL_COMMUNITY)
Admission: RE | Admit: 2020-04-14 | Discharge: 2020-04-14 | Disposition: A | Discharge status: Home / Self Care | Payer: BC Managed Care – PPO | Source: Ambulatory Visit | Attending: Plastic Surgery | Admitting: Plastic Surgery

## 2020-04-14 DIAGNOSIS — Z01812 Encounter for preprocedural laboratory examination: Secondary | ICD-10-CM | POA: Insufficient documentation

## 2020-04-14 DIAGNOSIS — Z20822 Contact with and (suspected) exposure to covid-19: Secondary | ICD-10-CM | POA: Diagnosis not present

## 2020-04-14 LAB — SARS CORONAVIRUS 2 (TAT 6-24 HRS): SARS Coronavirus 2: NEGATIVE

## 2020-04-16 NOTE — Anesthesia Preprocedure Evaluation (Addendum)
Anesthesia Evaluation  Patient identified by MRN, date of birth, ID band Patient awake    Reviewed: Allergy & Precautions, NPO status , Patient's Chart, lab work & pertinent test results  History of Anesthesia Complications (+) PONV  Airway Mallampati: II  TM Distance: >3 FB Neck ROM: Full    Dental no notable dental hx. (+) Teeth Intact, Dental Advisory Given   Pulmonary Current Smoker,    Pulmonary exam normal breath sounds clear to auscultation       Cardiovascular negative cardio ROS Normal cardiovascular exam Rhythm:Regular Rate:Normal     Neuro/Psych Anxiety negative neurological ROS     GI/Hepatic negative GI ROS, Neg liver ROS,   Endo/Other  negative endocrine ROS  Renal/GU negative Renal ROS     Musculoskeletal   Abdominal (+) + obese,   Peds  Hematology negative hematology ROS (+)   Anesthesia Other Findings   Reproductive/Obstetrics                            Anesthesia Physical Anesthesia Plan  ASA: II  Anesthesia Plan: General   Post-op Pain Management:    Induction: Intravenous  PONV Risk Score and Plan: 4 or greater and Treatment may vary due to age or medical condition, Ondansetron and Dexamethasone  Airway Management Planned: Oral ETT  Additional Equipment: None  Intra-op Plan:   Post-operative Plan: Extubation in OR  Informed Consent: I have reviewed the patients History and Physical, chart, labs and discussed the procedure including the risks, benefits and alternatives for the proposed anesthesia with the patient or authorized representative who has indicated his/her understanding and acceptance.     Dental advisory given  Plan Discussed with: CRNA and Anesthesiologist  Anesthesia Plan Comments:        Anesthesia Quick Evaluation

## 2020-04-17 ENCOUNTER — Encounter (HOSPITAL_BASED_OUTPATIENT_CLINIC_OR_DEPARTMENT_OTHER): Payer: Self-pay | Admitting: Plastic Surgery

## 2020-04-17 ENCOUNTER — Encounter (HOSPITAL_BASED_OUTPATIENT_CLINIC_OR_DEPARTMENT_OTHER)
Admission: RE | Disposition: A | Discharge status: Home / Self Care | Payer: Self-pay | Source: Home / Self Care | Attending: Plastic Surgery

## 2020-04-17 ENCOUNTER — Other Ambulatory Visit: Payer: Self-pay

## 2020-04-17 ENCOUNTER — Ambulatory Visit (HOSPITAL_BASED_OUTPATIENT_CLINIC_OR_DEPARTMENT_OTHER): Payer: BC Managed Care – PPO | Admitting: Certified Registered"

## 2020-04-17 ENCOUNTER — Ambulatory Visit (HOSPITAL_BASED_OUTPATIENT_CLINIC_OR_DEPARTMENT_OTHER)
Admission: RE | Admit: 2020-04-17 | Discharge: 2020-04-17 | Disposition: A | Discharge status: Home / Self Care | Payer: BC Managed Care – PPO | Attending: Plastic Surgery | Admitting: Plastic Surgery

## 2020-04-17 DIAGNOSIS — M4004 Postural kyphosis, thoracic region: Secondary | ICD-10-CM

## 2020-04-17 DIAGNOSIS — N62 Hypertrophy of breast: Secondary | ICD-10-CM

## 2020-04-17 DIAGNOSIS — M545 Low back pain: Secondary | ICD-10-CM

## 2020-04-17 DIAGNOSIS — F419 Anxiety disorder, unspecified: Secondary | ICD-10-CM | POA: Insufficient documentation

## 2020-04-17 DIAGNOSIS — N6011 Diffuse cystic mastopathy of right breast: Secondary | ICD-10-CM | POA: Insufficient documentation

## 2020-04-17 DIAGNOSIS — M546 Pain in thoracic spine: Secondary | ICD-10-CM

## 2020-04-17 DIAGNOSIS — N6012 Diffuse cystic mastopathy of left breast: Secondary | ICD-10-CM | POA: Insufficient documentation

## 2020-04-17 DIAGNOSIS — Z79899 Other long term (current) drug therapy: Secondary | ICD-10-CM | POA: Insufficient documentation

## 2020-04-17 HISTORY — PX: BREAST REDUCTION SURGERY: SHX8

## 2020-04-17 HISTORY — DX: Anxiety disorder, unspecified: F41.9

## 2020-04-17 LAB — POCT PREGNANCY, URINE: Preg Test, Ur: NEGATIVE

## 2020-04-17 SURGERY — MAMMOPLASTY, REDUCTION
Anesthesia: General | Site: Breast | Laterality: Bilateral

## 2020-04-17 MED ORDER — PROPOFOL 500 MG/50ML IV EMUL: INTRAVENOUS | Status: DC | PRN

## 2020-04-17 MED ORDER — ACETAMINOPHEN 10 MG/ML IV SOLN
INTRAVENOUS | Status: AC
  Filled 2020-04-17: qty 100

## 2020-04-17 MED ORDER — SUGAMMADEX SODIUM 200 MG/2ML IV SOLN
INTRAVENOUS | Status: DC | PRN
  Administered 2020-04-17: 300 mg via INTRAVENOUS

## 2020-04-17 MED ORDER — DEXMEDETOMIDINE (PRECEDEX) IN NS 20 MCG/5ML (4 MCG/ML) IV SYRINGE
PREFILLED_SYRINGE | INTRAVENOUS | Status: DC | PRN
  Administered 2020-04-17 (×2): 4 ug via INTRAVENOUS

## 2020-04-17 MED ORDER — EPHEDRINE 5 MG/ML INJ
INTRAVENOUS | Status: AC
  Filled 2020-04-17: qty 10

## 2020-04-17 MED ORDER — ROCURONIUM BROMIDE 100 MG/10ML IV SOLN
INTRAVENOUS | Status: DC | PRN
  Administered 2020-04-17: 60 mg via INTRAVENOUS

## 2020-04-17 MED ORDER — DEXAMETHASONE SODIUM PHOSPHATE 4 MG/ML IJ SOLN
INTRAMUSCULAR | Status: DC | PRN
  Administered 2020-04-17: 10 mg via INTRAVENOUS

## 2020-04-17 MED ORDER — AMISULPRIDE (ANTIEMETIC) 5 MG/2ML IV SOLN: 10.0000 mg | Freq: Once | INTRAVENOUS | Status: DC | PRN

## 2020-04-17 MED ORDER — OXYCODONE HCL 5 MG/5ML PO SOLN: 5.0000 mg | Freq: Once | ORAL | Status: AC | PRN

## 2020-04-17 MED ORDER — EPHEDRINE SULFATE 50 MG/ML IJ SOLN
INTRAMUSCULAR | Status: DC | PRN
  Administered 2020-04-17: 10 mg via INTRAVENOUS

## 2020-04-17 MED ORDER — PROPOFOL 500 MG/50ML IV EMUL
INTRAVENOUS | Status: DC | PRN
  Administered 2020-04-17: 25 ug/kg/min via INTRAVENOUS

## 2020-04-17 MED ORDER — MIDAZOLAM HCL 5 MG/5ML IJ SOLN
INTRAMUSCULAR | Status: DC | PRN
  Administered 2020-04-17: 2 mg via INTRAVENOUS

## 2020-04-17 MED ORDER — DEXMEDETOMIDINE (PRECEDEX) IN NS 20 MCG/5ML (4 MCG/ML) IV SYRINGE
PREFILLED_SYRINGE | INTRAVENOUS | Status: AC
  Filled 2020-04-17: qty 5

## 2020-04-17 MED ORDER — MEPERIDINE HCL 25 MG/ML IJ SOLN: 6.2500 mg | INTRAMUSCULAR | Status: DC | PRN

## 2020-04-17 MED ORDER — PHENYLEPHRINE 40 MCG/ML (10ML) SYRINGE FOR IV PUSH (FOR BLOOD PRESSURE SUPPORT)
PREFILLED_SYRINGE | INTRAVENOUS | Status: AC
  Filled 2020-04-17: qty 10

## 2020-04-17 MED ORDER — SCOPOLAMINE 1 MG/3DAYS TD PT72
1.0000 | MEDICATED_PATCH | TRANSDERMAL | Status: DC
  Administered 2020-04-17: 1.5 mg via TRANSDERMAL

## 2020-04-17 MED ORDER — CEFAZOLIN SODIUM-DEXTROSE 2-4 GM/100ML-% IV SOLN
2.0000 g | INTRAVENOUS | Status: AC
  Administered 2020-04-17: 2 g via INTRAVENOUS

## 2020-04-17 MED ORDER — FENTANYL CITRATE (PF) 100 MCG/2ML IJ SOLN
INTRAMUSCULAR | Status: AC
  Filled 2020-04-17: qty 2

## 2020-04-17 MED ORDER — LACTATED RINGERS IV SOLN: INTRAVENOUS | Status: DC | PRN

## 2020-04-17 MED ORDER — SCOPOLAMINE 1 MG/3DAYS TD PT72
MEDICATED_PATCH | TRANSDERMAL | Status: AC
  Filled 2020-04-17: qty 1

## 2020-04-17 MED ORDER — MIDAZOLAM HCL 2 MG/2ML IJ SOLN
INTRAMUSCULAR | Status: AC
  Filled 2020-04-17: qty 2

## 2020-04-17 MED ORDER — PHENYLEPHRINE HCL (PRESSORS) 10 MG/ML IV SOLN
INTRAVENOUS | Status: DC | PRN
  Administered 2020-04-17: 80 ug via INTRAVENOUS

## 2020-04-17 MED ORDER — HYDROMORPHONE HCL 1 MG/ML IJ SOLN
INTRAMUSCULAR | Status: AC
  Filled 2020-04-17: qty 0.5

## 2020-04-17 MED ORDER — HYDROMORPHONE HCL 1 MG/ML IJ SOLN
0.2500 mg | INTRAMUSCULAR | Status: DC | PRN
  Administered 2020-04-17 (×2): 0.5 mg via INTRAVENOUS

## 2020-04-17 MED ORDER — PROPOFOL 10 MG/ML IV BOLUS
INTRAVENOUS | Status: DC | PRN
  Administered 2020-04-17: 200 mg via INTRAVENOUS

## 2020-04-17 MED ORDER — FENTANYL CITRATE (PF) 100 MCG/2ML IJ SOLN
INTRAMUSCULAR | Status: DC | PRN
Start: 2020-04-17 — End: 2020-04-17
  Administered 2020-04-17 (×2): 50 ug via INTRAVENOUS
  Administered 2020-04-17: 100 ug via INTRAVENOUS

## 2020-04-17 MED ORDER — ONDANSETRON HCL 4 MG/2ML IJ SOLN
INTRAMUSCULAR | Status: DC | PRN
  Administered 2020-04-17: 4 mg via INTRAVENOUS

## 2020-04-17 MED ORDER — OXYCODONE HCL 5 MG PO TABS
ORAL_TABLET | ORAL | Status: AC
  Filled 2020-04-17: qty 1

## 2020-04-17 MED ORDER — ONDANSETRON HCL 4 MG/2ML IJ SOLN: 4.0000 mg | Freq: Once | INTRAMUSCULAR | Status: DC | PRN

## 2020-04-17 MED ORDER — LACTATED RINGERS IV SOLN: INTRAVENOUS | Status: DC

## 2020-04-17 MED ORDER — OXYCODONE HCL 5 MG PO TABS
5.0000 mg | ORAL_TABLET | Freq: Once | ORAL | Status: AC | PRN
  Administered 2020-04-17: 5 mg via ORAL

## 2020-04-17 MED ORDER — ACETAMINOPHEN 10 MG/ML IV SOLN
1000.0000 mg | Freq: Once | INTRAVENOUS | Status: DC | PRN
  Administered 2020-04-17: 1000 mg via INTRAVENOUS

## 2020-04-17 MED ORDER — CEFAZOLIN SODIUM-DEXTROSE 2-4 GM/100ML-% IV SOLN
INTRAVENOUS | Status: AC
  Filled 2020-04-17: qty 100

## 2020-04-17 SURGICAL SUPPLY — 72 items
APL PRP STRL LF DISP 70% ISPRP (MISCELLANEOUS) ×2
APL SKNCLS STERI-STRIP NONHPOA (GAUZE/BANDAGES/DRESSINGS) ×2
BAG DECANTER FOR FLEXI CONT (MISCELLANEOUS) ×1 IMPLANT
BENZOIN TINCTURE PRP APPL 2/3 (GAUZE/BANDAGES/DRESSINGS) ×4 IMPLANT
BLADE SURG 10 STRL SS (BLADE) ×4 IMPLANT
BLADE SURG 15 STRL LF DISP TIS (BLADE) IMPLANT
BLADE SURG 15 STRL SS (BLADE)
BNDG ELASTIC 6X5.8 VLCR STR LF (GAUZE/BANDAGES/DRESSINGS) ×3 IMPLANT
BNDG GAUZE ELAST 4 BULKY (GAUZE/BANDAGES/DRESSINGS) ×2 IMPLANT
CANISTER SUCT 1200ML W/VALVE (MISCELLANEOUS) ×2 IMPLANT
CHLORAPREP W/TINT 26 (MISCELLANEOUS) ×4 IMPLANT
CLIP VESOCCLUDE MED 6/CT (CLIP) IMPLANT
COVER BACK TABLE 60X90IN (DRAPES) ×2 IMPLANT
COVER MAYO STAND STRL (DRAPES) ×2 IMPLANT
COVER WAND RF STERILE (DRAPES) IMPLANT
DECANTER SPIKE VIAL GLASS SM (MISCELLANEOUS) IMPLANT
DRAIN CHANNEL 15F RND FF W/TCR (WOUND CARE) IMPLANT
DRAPE LAPAROSCOPIC ABDOMINAL (DRAPES) ×2 IMPLANT
DRAPE UTILITY XL STRL (DRAPES) ×2 IMPLANT
DRSG PAD ABDOMINAL 8X10 ST (GAUZE/BANDAGES/DRESSINGS) ×6 IMPLANT
ELECT REM PT RETURN 9FT ADLT (ELECTROSURGICAL) ×2
ELECTRODE REM PT RTRN 9FT ADLT (ELECTROSURGICAL) ×1 IMPLANT
EVACUATOR SILICONE 100CC (DRAIN) IMPLANT
GAUZE SPONGE 4X4 12PLY STRL (GAUZE/BANDAGES/DRESSINGS) ×4 IMPLANT
GAUZE XEROFORM 5X9 LF (GAUZE/BANDAGES/DRESSINGS) IMPLANT
GLOVE BIO SURGEON STRL SZ 6.5 (GLOVE) ×1 IMPLANT
GLOVE BIO SURGEON STRL SZ7.5 (GLOVE) IMPLANT
GLOVE BIOGEL M STRL SZ7.5 (GLOVE) ×3 IMPLANT
GLOVE BIOGEL PI IND STRL 8 (GLOVE) IMPLANT
GLOVE BIOGEL PI INDICATOR 8 (GLOVE) ×1
GLOVE ECLIPSE 6.5 STRL STRAW (GLOVE) IMPLANT
GOWN STRL REUS W/ TWL LRG LVL3 (GOWN DISPOSABLE) ×2 IMPLANT
GOWN STRL REUS W/TWL LRG LVL3 (GOWN DISPOSABLE) ×4
MARKER SKIN DUAL TIP RULER LAB (MISCELLANEOUS) IMPLANT
NDL FILTER BLUNT 18X1 1/2 (NEEDLE) ×1 IMPLANT
NDL HYPO 25X1 1.5 SAFETY (NEEDLE) IMPLANT
NDL SAFETY ECLIPSE 18X1.5 (NEEDLE) ×1 IMPLANT
NDL SPNL 18GX3.5 QUINCKE PK (NEEDLE) ×1 IMPLANT
NEEDLE FILTER BLUNT 18X 1/2SAF (NEEDLE) ×1
NEEDLE FILTER BLUNT 18X1 1/2 (NEEDLE) ×1 IMPLANT
NEEDLE HYPO 18GX1.5 SHARP (NEEDLE) ×2
NEEDLE HYPO 25X1 1.5 SAFETY (NEEDLE) IMPLANT
NEEDLE SPNL 18GX3.5 QUINCKE PK (NEEDLE) ×2 IMPLANT
NS IRRIG 1000ML POUR BTL (IV SOLUTION) ×2 IMPLANT
PACK BASIN DAY SURGERY FS (CUSTOM PROCEDURE TRAY) ×2 IMPLANT
PENCIL SMOKE EVACUATOR (MISCELLANEOUS) ×2 IMPLANT
PIN SAFETY STERILE (MISCELLANEOUS) IMPLANT
SHEET MEDIUM DRAPE 40X70 STRL (DRAPES) IMPLANT
SLEEVE SCD COMPRESS KNEE MED (MISCELLANEOUS) ×2 IMPLANT
SPONGE LAP 18X18 RF (DISPOSABLE) ×6 IMPLANT
STAPLER INSORB 30 2030 C-SECTI (MISCELLANEOUS) ×3 IMPLANT
STAPLER VISISTAT 35W (STAPLE) ×2 IMPLANT
STRIP SUTURE WOUND CLOSURE 1/2 (MISCELLANEOUS) ×6 IMPLANT
SUT CHROMIC 4 0 PS 2 18 (SUTURE) IMPLANT
SUT ETHILON 2 0 FS 18 (SUTURE) IMPLANT
SUT ETHILON 3 0 PS 1 (SUTURE) IMPLANT
SUT MNCRL AB 4-0 PS2 18 (SUTURE) ×6 IMPLANT
SUT PDS 3-0 CT2 (SUTURE) ×6
SUT PDS II 3-0 CT2 27 ABS (SUTURE) ×2 IMPLANT
SUT VIC AB 3-0 PS1 18 (SUTURE)
SUT VIC AB 3-0 PS1 18XBRD (SUTURE) IMPLANT
SUT VLOC 90 P-14 23 (SUTURE) ×4 IMPLANT
SYR 50ML LL SCALE MARK (SYRINGE) ×4 IMPLANT
SYR BULB IRRIG 60ML STRL (SYRINGE) ×2 IMPLANT
SYR CONTROL 10ML LL (SYRINGE) IMPLANT
TAPE MEASURE VINYL STERILE (MISCELLANEOUS) IMPLANT
TOWEL GREEN STERILE FF (TOWEL DISPOSABLE) ×4 IMPLANT
TRAY FOLEY W/BAG SLVR 14FR LF (SET/KITS/TRAYS/PACK) IMPLANT
TUBE CONNECTING 20X1/4 (TUBING) ×2 IMPLANT
TUBING INFILTRATION IT-10001 (TUBING) ×2 IMPLANT
UNDERPAD 30X36 HEAVY ABSORB (UNDERPADS AND DIAPERS) ×4 IMPLANT
YANKAUER SUCT BULB TIP NO VENT (SUCTIONS) ×2 IMPLANT

## 2020-04-17 NOTE — Anesthesia Procedure Notes (Signed)
Procedure Name: Intubation Date/Time: 04/17/2020 10:22 AM Performed by: Signe Colt, CRNA Pre-anesthesia Checklist: Patient identified, Emergency Drugs available, Suction available and Patient being monitored Patient Re-evaluated:Patient Re-evaluated prior to induction Oxygen Delivery Method: Circle system utilized Preoxygenation: Pre-oxygenation with 100% oxygen Induction Type: IV induction Ventilation: Mask ventilation without difficulty Laryngoscope Size: Mac and 3 Grade View: Grade I Tube type: Oral Tube size: 7.0 mm Number of attempts: 1 Airway Equipment and Method: Stylet and Oral airway Placement Confirmation: ETT inserted through vocal cords under direct vision,  positive ETCO2 and breath sounds checked- equal and bilateral Secured at: 21 cm Tube secured with: Tape Dental Injury: Teeth and Oropharynx as per pre-operative assessment

## 2020-04-17 NOTE — Discharge Instructions (Addendum)
Activity As tolerated: NO showers for 3 days. Keep ACE wrap on breasts until then. After showering, put ACE wrap back on, this is important for compression. NO driving while in pain, taking pain medication or if you are unable to safely react to traffic. No heavy activities Take Pain medication (Norco) as needed for severe pain. Otherwise, you can use ibuprofen or tylenol as needed. Avoid more than 3,000 mg of tylenol in 24 hours. Norco has 325mg  of tylenol per dose.  Diet: Regular. Drink plenty of fluids and eat healthy, high protein, low carbs.  Wound Care: Keep dressing clean & dry. You may change bandages after showering if you continue to notice some drainage. You can reuse bandages if they are not dirty/soiled.  Special Instructions: Call Doctor if any unusual problems occur such as pain, excessive Bleeding, unrelieved Nausea/vomiting, Fever &/or chills  Follow-up appointment: Scheduled for next week.   Post Anesthesia Home Care Instructions  Activity: Get plenty of rest for the remainder of the day. A responsible individual must stay with you for 24 hours following the procedure.  For the next 24 hours, DO NOT: -Drive a car - -Drink alcoholic beverages -Take any medication unless instructed by your physician -Make any legal decisions or sign important papers.  Meals: Start with liquid foods such as gelatin or soup. Progress to regular foods as tolerated. Avoid greasy, spicy, heavy foods. If nausea and/or vomiting occur, drink only clear liquids until the nausea and/or vomiting subsides. Call your physician if vomiting continues.  Special Instructions/Symptoms: Your throat may feel dry or sore from the anesthesia or the breathing tube placed in your throat during surgery. If this causes discomfort, gargle with warm salt water. The discomfort should disappear within 24 hours.  If you had a scopolamine patch placed behind your ear for the management of post-  operative nausea and/or vomiting:  1. The medication in the patch is effective for 72 hours, after which it should be removed.  Wrap patch in a tissue and discard in the trash. Wash hands thoroughly with soap and water. 2. You may remove the patch earlier than 72 hours if you experience unpleasant side effects which may include dry mouth, dizziness or visual disturbances. 3. Avoid touching the patch. Wash your hands with soap and water after contact with the patch.    May take Tylenol after 6:30pm today 04/17/2020

## 2020-04-17 NOTE — Brief Op Note (Signed)
04/17/2020  11:59 AM  PATIENT:  Peter Garter  44 y.o. female  PRE-OPERATIVE DIAGNOSIS:  macromastia  POST-OPERATIVE DIAGNOSIS:  macromastia  PROCEDURE:  Procedure(s) with comments: MAMMARY REDUCTION  (BREAST) (Bilateral) - 2.5 hours, please  SURGEON:  Surgeon(s) and Role:    * Mykayla Brinton, Wendy Poet, MD - Primary  PHYSICIAN ASSISTANT: Materials engineer, PA  ASSISTANTS: none   ANESTHESIA:   general  EBL:  25   BLOOD ADMINISTERED:none  DRAINS: none   LOCAL MEDICATIONS USED:  MARCAINE     SPECIMEN:  Source of Specimen:  r and l breast  DISPOSITION OF SPECIMEN:  PATHOLOGY  COUNTS:  YES  TOURNIQUET:  * No tourniquets in log *  DICTATION: .Dragon Dictation  PLAN OF CARE: Discharge to home after PACU  PATIENT DISPOSITION:  PACU - hemodynamically stable.   Delay start of Pharmacological VTE agent (>24hrs) due to surgical blood loss or risk of bleeding: not applicable

## 2020-04-17 NOTE — Transfer of Care (Signed)
Immediate Anesthesia Transfer of Care Note  Patient: Sierra Flores  Procedure(s) Performed: MAMMARY REDUCTION  (BREAST) (Bilateral Breast)  Patient Location: PACU  Anesthesia Type:General  Level of Consciousness: awake and oriented  Airway & Oxygen Therapy: Patient Spontanous Breathing and Patient connected to face mask oxygen  Post-op Assessment: Report given to RN and Post -op Vital signs reviewed and stable  Post vital signs: Reviewed and stable  Last Vitals:  Vitals Value Taken Time  BP 127/87 04/17/20 1216  Temp    Pulse 105 04/17/20 1217  Resp 18 04/17/20 1217  SpO2 100 % 04/17/20 1217  Vitals shown include unvalidated device data.  Last Pain:  Vitals:   04/17/20 0905  TempSrc: Oral  PainSc: 3          Complications: No complications documented.

## 2020-04-17 NOTE — Op Note (Signed)
Operative Note   DATE OF OPERATION: 04/17/2020  LOCATION: [facility]   SURGICAL DEPARTMENT: Plastic Surgery  PREOPERATIVE DIAGNOSES: Bilateral symptomatic macromastia.  POSTOPERATIVE DIAGNOSES:  same  PROCEDURE: Bilateral breast reduction with superomedial pedicle.  SURGEON: Ancil Linsey, MD  ASSISTANT: Zadie Cleverly, PA  ANESTHESIA: General.  COMPLICATIONS: None.   INDICATIONS FOR PROCEDURE:  The patient, Sierra Flores is a 44 y.o. female born on November 15, 1975, is here for treatment of bilateral symptomatic macromastia. MRN: [id number]  CONSENT:  Informed consent was obtained directly from the patient. Risks, benefits and alternatives were fully discussed. Specific risks including but not limited to bleeding, infection, hematoma, seroma, scarring, pain, infection, contracture, asymmetry, wound healing problems, and need for further surgery were all discussed. The patient did have an ample opportunity to have questions answered to satisfaction.   DESCRIPTION OF PROCEDURE:  The patient was marked preoperatively for a Wise pattern skin excision.  The patient was taken to the operating room. SCDs were placed and antibiotics were given. General anesthesia was administered.The patient's operative site was prepped and draped in a sterile fashion. A time out was performed and all information was confirmed to be correct.  Right Breast: The breast was infiltrated with tumescent solution to help with hemostasis.  The nipple was marked with a cookie cutter.  A superomedial pedicle was drawn out with the base of at least 8 cm in size.  A breast tourniquet was then applied and the pedicle was de-epithelialized.  Breast tourniquet was then let down and all incisions were made with a 10 blade.  The pedicle was then isolated down to the chest wall with cautery and the excision was performed removing tissue primarily inferiorly and laterally.  Hemostasis was obtained and the wound  was stapled closed.  Left breast:  The breast was infiltrated with tumescent solution to help with hemostasis.  The nipple was marked with a cookie cutter.  A superomedial pedicle was drawn out with the base of at least 8 cm in size.  A breast tourniquet was then applied and the pedicle was de-epithelialized.  Breast tourniquet was then let down and all incisions were made with a 10 blade.  The pedicle was then isolated down to the chest wall with cautery and the excision was performed removing tissue primarily inferiorly and laterally.  Hemostasis was obtained and the wound was stapled closed.  Patient was then set up to check for size and symmetry.  Minor modifications were made.  This resulted in a total of 852 g removed from the right side and 867 g removed from the left side.  The inframammary incision was closed with a combination of buried in-sorb staples and a running 3-0 Quill suture.  The vertical and periareolar limbs were closed with interrupted buried 4-0 Monocryl and a running 4-0 Quill suture.  Steri-Strips were then applied along with a soft dressing and Ace wrap.  The patient tolerated the procedure well.  There were no complications. The patient was allowed to wake from anesthesia, extubated and taken to the recovery room in satisfactory condition.  I was present for the entire procedure.

## 2020-04-17 NOTE — Interval H&P Note (Signed)
History and Physical Interval Note:  04/17/2020 9:50 AM  Sierra Flores  has presented today for surgery, with the diagnosis of macromastia.  The various methods of treatment have been discussed with the patient and family. After consideration of risks, benefits and other options for treatment, the patient has consented to  Procedure(s) with comments: MAMMARY REDUCTION  (BREAST) (Bilateral) - 2.5 hours, please as a surgical intervention.  The patient's history has been reviewed, patient examined, no change in status, stable for surgery.  I have reviewed the patient's chart and labs.  Questions were answered to the patient's satisfaction.     Sierra Flores

## 2020-04-18 ENCOUNTER — Encounter (HOSPITAL_BASED_OUTPATIENT_CLINIC_OR_DEPARTMENT_OTHER): Payer: Self-pay | Admitting: Plastic Surgery

## 2020-04-18 LAB — SURGICAL PATHOLOGY

## 2020-04-18 NOTE — Anesthesia Postprocedure Evaluation (Signed)
Anesthesia Post Note  Patient: Health visitor  Procedure(s) Performed: MAMMARY REDUCTION  (BREAST) (Bilateral Breast)     Patient location during evaluation: PACU Anesthesia Type: General Level of consciousness: awake and alert Pain management: pain level controlled Vital Signs Assessment: post-procedure vital signs reviewed and stable Respiratory status: spontaneous breathing, nonlabored ventilation, respiratory function stable and patient connected to nasal cannula oxygen Cardiovascular status: blood pressure returned to baseline and stable Postop Assessment: no apparent nausea or vomiting Anesthetic complications: no   No complications documented.  Last Vitals:  Vitals:   04/17/20 1315 04/17/20 1334  BP: 121/80 122/80  Pulse: 93 98  Resp: 11 18  Temp:  36.9 C  SpO2: 93% 97%    Last Pain:  Vitals:   04/17/20 1334  TempSrc: Oral                 Trevor Iha

## 2020-04-20 ENCOUNTER — Telehealth: Payer: Self-pay | Admitting: Plastic Surgery

## 2020-04-20 NOTE — Telephone Encounter (Signed)
Advised patient can purchase ace wraps at any drug store or [facility] in the first aid area. If she needs 4x4s or ABD pads, she can get those as well. Reminded her she needs to wear compression 24/7 unless she is taking a shower.  Patient understood and agreed.

## 2020-04-20 NOTE — Telephone Encounter (Signed)
Patient said it's been 3 days and she was told she could shower, but to put bandage back on. She would like to put on a new one but wanted to know where she can get this from. Please call to advise where she can get appropriate bandage to cover post reduction.

## 2020-04-21 ENCOUNTER — Telehealth: Payer: Self-pay

## 2020-04-21 NOTE — Telephone Encounter (Signed)
Received call from patient's husband requesting call back regarding post op care. He has questions about showering and possible medication interactions.

## 2020-04-21 NOTE — Telephone Encounter (Signed)
Spoke with patient's husband to discuss dressing changes, showering, taking medications.  All of his questions were answered, recommend calling with any further questions or concerns

## 2020-04-26 ENCOUNTER — Other Ambulatory Visit: Payer: Self-pay

## 2020-04-26 ENCOUNTER — Ambulatory Visit (INDEPENDENT_AMBULATORY_CARE_PROVIDER_SITE_OTHER): Payer: BC Managed Care – PPO | Admitting: Plastic Surgery

## 2020-04-26 ENCOUNTER — Encounter: Payer: BC Managed Care – PPO | Admitting: Plastic Surgery

## 2020-04-26 ENCOUNTER — Encounter: Payer: Self-pay | Admitting: Plastic Surgery

## 2020-04-26 VITALS — BP 100/68 | HR 85 | Temp 98.4°F

## 2020-04-26 DIAGNOSIS — N62 Hypertrophy of breast: Secondary | ICD-10-CM

## 2020-04-26 NOTE — Progress Notes (Signed)
Patient presents 1 week postop from bilateral breast reduction. Everything seems to be going well. On examination incisions are intact and nipple areolar complexes are viable. There is no areas of skin perfusion concerns. We will plan to have her continue her compressive garment and follow-up with [place] again in a couple weeks. We did discuss avoiding strenuous activity. All of her and her husband's questions were answered.

## 2020-05-02 ENCOUNTER — Encounter: Payer: Self-pay | Admitting: Plastic Surgery

## 2020-05-04 ENCOUNTER — Other Ambulatory Visit: Payer: Self-pay

## 2020-05-04 ENCOUNTER — Ambulatory Visit (INDEPENDENT_AMBULATORY_CARE_PROVIDER_SITE_OTHER): Payer: BC Managed Care – PPO | Admitting: Surgical

## 2020-05-04 ENCOUNTER — Encounter: Payer: Self-pay | Admitting: Surgical

## 2020-05-04 VITALS — BP 125/81 | HR 77 | Temp 98.8°F

## 2020-05-04 DIAGNOSIS — Z9889 Other specified postprocedural states: Secondary | ICD-10-CM

## 2020-05-04 DIAGNOSIS — N62 Hypertrophy of breast: Secondary | ICD-10-CM

## 2020-05-04 NOTE — Progress Notes (Signed)
Patient is a 44 year old female here for follow-up after bilateral breast reduction on 04/17/2020 with Dr. Arita Miss.  She is here for a 2-week follow-up.  She is overall doing well reports neck and back pain have improved.  Chaperone present on exam Bilateral breast incisions are healing nicely, bilateral NAC's with good color, good cap refill, bilateral breasts are soft.  No fluid wave noted.  No erythema noted.  Incision is intact.  She has a small dehiscence at the junction of the vertical limb and inframammary fold on bilateral breasts, wounds are approximately 2 mm each in size.  No surrounding erythema.  Recommend Vaseline gauze daily to left inframammary fold wounds, suspect this will heal up quickly. No sign of infection. Recommend continue to wear sports bra 24/7 for few more weeks, avoid strenuous activity for a total of 6 weeks postop pending any changes. Recommend calling with any questions or concerns, follow-up in 3 weeks for reevaluation.

## 2020-05-10 ENCOUNTER — Encounter: Payer: BC Managed Care – PPO | Admitting: Surgical

## 2020-05-26 ENCOUNTER — Ambulatory Visit (INDEPENDENT_AMBULATORY_CARE_PROVIDER_SITE_OTHER): Payer: BC Managed Care – PPO | Admitting: Surgical

## 2020-05-26 ENCOUNTER — Other Ambulatory Visit: Payer: Self-pay

## 2020-05-26 ENCOUNTER — Encounter: Payer: Self-pay | Admitting: Surgical

## 2020-05-26 VITALS — BP 135/76 | HR 73 | Temp 97.3°F

## 2020-05-26 DIAGNOSIS — Z9889 Other specified postprocedural states: Secondary | ICD-10-CM

## 2020-05-26 NOTE — Progress Notes (Signed)
Patient is a 44 year old female here for follow-up after bilateral breast reduction on 04/17/2020 with Dr. Arita Miss.  She is doing really well, she did have some small bilateral inframammary fold wounds that are doing well.  She has no complaints today and is overall pleased with the healing process.  Chaperone present on exam On exam bilateral breast incisions are intact, bilateral breasts are symmetric, soft, no erythema noted.  She does have a small wound at the junction of left inframammary fold and vertical limb that is scabbing that is approximately 0.2 cm.  No drainage noted.  No sign of subcutaneous fluid collections  Recommend continue her compression, avoid bra with underwire Recommend avoiding strenuous activity for 1 more week We discussed scheduling an additional follow-up or following up as needed, patient feels as if she is comfortable following up as needed at this point. I recommend she call with any questions or concerns Continue to apply Vaseline daily to left breast inframammary fold wound, suspect this will heal up in the next 1 to 2 weeks.

## 2020-05-31 ENCOUNTER — Other Ambulatory Visit: Payer: Self-pay | Admitting: Physician Assistant

## 2020-05-31 DIAGNOSIS — R109 Unspecified abdominal pain: Secondary | ICD-10-CM

## 2020-06-08 ENCOUNTER — Other Ambulatory Visit: Payer: BC Managed Care – PPO

## 2020-06-13 ENCOUNTER — Other Ambulatory Visit: Payer: Self-pay

## 2020-06-13 DIAGNOSIS — Z20822 Contact with and (suspected) exposure to covid-19: Secondary | ICD-10-CM | POA: Insufficient documentation

## 2020-06-13 DIAGNOSIS — K8000 Calculus of gallbladder with acute cholecystitis without obstruction: Secondary | ICD-10-CM | POA: Diagnosis not present

## 2020-06-13 DIAGNOSIS — Z720 Tobacco use: Secondary | ICD-10-CM | POA: Diagnosis not present

## 2020-06-13 DIAGNOSIS — R109 Unspecified abdominal pain: Secondary | ICD-10-CM | POA: Diagnosis present

## 2020-06-13 NOTE — ED Triage Notes (Signed)
Patient arrived with complaints of mid upper abdominal cramping that started Sunday. Reports some constipation, nausea, and vomiting. Reports trying miralax and tums with no relief.

## 2020-06-14 ENCOUNTER — Observation Stay (HOSPITAL_COMMUNITY)
Admission: EM | Admit: 2020-06-14 | Discharge: 2020-06-15 | Disposition: A | Discharge status: Home / Self Care | Payer: BC Managed Care – PPO | Attending: Surgery | Admitting: Surgery

## 2020-06-14 ENCOUNTER — Observation Stay (HOSPITAL_COMMUNITY): Payer: BC Managed Care – PPO | Admitting: Anesthesiology

## 2020-06-14 ENCOUNTER — Observation Stay (HOSPITAL_COMMUNITY): Payer: BC Managed Care – PPO

## 2020-06-14 ENCOUNTER — Encounter (HOSPITAL_COMMUNITY)
Admission: EM | Disposition: A | Discharge status: Home / Self Care | Payer: Self-pay | Source: Home / Self Care | Attending: Emergency Medicine

## 2020-06-14 ENCOUNTER — Emergency Department (HOSPITAL_COMMUNITY): Payer: BC Managed Care – PPO

## 2020-06-14 ENCOUNTER — Encounter (HOSPITAL_COMMUNITY): Payer: Self-pay

## 2020-06-14 DIAGNOSIS — Z419 Encounter for procedure for purposes other than remedying health state, unspecified: Secondary | ICD-10-CM

## 2020-06-14 DIAGNOSIS — K810 Acute cholecystitis: Secondary | ICD-10-CM

## 2020-06-14 DIAGNOSIS — K8010 Calculus of gallbladder with chronic cholecystitis without obstruction: Secondary | ICD-10-CM | POA: Diagnosis present

## 2020-06-14 HISTORY — PX: CHOLECYSTECTOMY: SHX55

## 2020-06-14 LAB — RESPIRATORY PANEL BY RT PCR (FLU A&B, COVID)
Influenza A by PCR: NEGATIVE
Influenza B by PCR: NEGATIVE
SARS Coronavirus 2 by RT PCR: NEGATIVE

## 2020-06-14 LAB — COMPREHENSIVE METABOLIC PANEL
ALT: 16 U/L (ref 0–44)
AST: 17 U/L (ref 15–41)
Albumin: 4.3 g/dL (ref 3.5–5.0)
Alkaline Phosphatase: 81 U/L (ref 38–126)
Anion gap: 10 (ref 5–15)
BUN: 7 mg/dL (ref 6–20)
CO2: 23 mmol/L (ref 22–32)
Calcium: 9.2 mg/dL (ref 8.9–10.3)
Chloride: 100 mmol/L (ref 98–111)
Creatinine, Ser: 0.67 mg/dL (ref 0.44–1.00)
GFR, Estimated: >60 mL/min (ref >60)
Glucose, Bld: 145 mg/dL — ABNORMAL HIGH (ref 70–99)
Potassium: 3.7 mmol/L (ref 3.5–5.1)
Sodium: 133 mmol/L — ABNORMAL LOW (ref 135–145)
Total Bilirubin: 0.5 mg/dL (ref 0.3–1.2)
Total Protein: 7.6 g/dL (ref 6.5–8.1)

## 2020-06-14 LAB — CBC WITH DIFFERENTIAL/PLATELET
Abs Immature Granulocytes: 0.07 10*3/uL (ref 0.00–0.07)
Basophils Absolute: 0 10*3/uL (ref 0.0–0.1)
Basophils Relative: 0 %
Eosinophils Absolute: 0 10*3/uL (ref 0.0–0.5)
Eosinophils Relative: 0 %
HCT: 39.3 % (ref 36.0–46.0)
Hemoglobin: 12.6 g/dL (ref 12.0–15.0)
Immature Granulocytes: 0 %
Lymphocytes Relative: 7 %
Lymphs Abs: 1.3 10*3/uL (ref 0.7–4.0)
MCH: 24.6 pg — ABNORMAL LOW (ref 26.0–34.0)
MCHC: 32.1 g/dL (ref 30.0–36.0)
MCV: 76.6 fL — ABNORMAL LOW (ref 80.0–100.0)
Monocytes Absolute: 0.7 10*3/uL (ref 0.1–1.0)
Monocytes Relative: 4 %
Neutro Abs: 15.8 10*3/uL — ABNORMAL HIGH (ref 1.7–7.7)
Neutrophils Relative %: 89 %
Platelets: 321 10*3/uL (ref 150–400)
RBC: 5.13 MIL/uL — ABNORMAL HIGH (ref 3.87–5.11)
RDW: 16.3 % — ABNORMAL HIGH (ref 11.5–15.5)
WBC: 18 10*3/uL — ABNORMAL HIGH (ref 4.0–10.5)
nRBC: 0 % (ref 0.0–0.2)

## 2020-06-14 LAB — URINALYSIS, ROUTINE W REFLEX MICROSCOPIC
Bilirubin Urine: NEGATIVE
Glucose, UA: NEGATIVE mg/dL
Ketones, ur: 20 mg/dL — AB
Leukocytes,Ua: NEGATIVE
Nitrite: NEGATIVE
Protein, ur: 30 mg/dL — AB
Specific Gravity, Urine: 1.02 (ref 1.005–1.030)
pH: 5 (ref 5.0–8.0)

## 2020-06-14 LAB — MAGNESIUM: Magnesium: 1.8 mg/dL (ref 1.7–2.4)

## 2020-06-14 LAB — HIV ANTIBODY (ROUTINE TESTING W REFLEX): HIV Screen 4th Generation wRfx: NONREACTIVE

## 2020-06-14 LAB — MRSA PCR SCREENING: MRSA by PCR: NEGATIVE

## 2020-06-14 LAB — LIPASE, BLOOD: Lipase: 26 U/L (ref 11–51)

## 2020-06-14 LAB — I-STAT BETA HCG BLOOD, ED (MC, WL, AP ONLY): I-stat hCG, quantitative: <5 m[IU]/mL (ref <5)

## 2020-06-14 SURGERY — LAPAROSCOPIC CHOLECYSTECTOMY WITH INTRAOPERATIVE CHOLANGIOGRAM
Anesthesia: General | Site: Abdomen

## 2020-06-14 MED ORDER — OXYCODONE HCL 5 MG/5ML PO SOLN: 5.0000 mg | Freq: Once | ORAL | Status: DC | PRN

## 2020-06-14 MED ORDER — ONDANSETRON HCL 4 MG/2ML IJ SOLN
INTRAMUSCULAR | Status: DC | PRN
  Administered 2020-06-14: 4 mg via INTRAVENOUS

## 2020-06-14 MED ORDER — HYDROMORPHONE HCL 1 MG/ML IJ SOLN: 0.2500 mg | INTRAMUSCULAR | Status: DC | PRN

## 2020-06-14 MED ORDER — OXYCODONE HCL 5 MG PO TABS: 5.0000 mg | ORAL_TABLET | Freq: Once | ORAL | Status: DC | PRN

## 2020-06-14 MED ORDER — SODIUM CHLORIDE (PF) 0.9 % IJ SOLN
INTRAMUSCULAR | Status: AC
  Filled 2020-06-14: qty 50

## 2020-06-14 MED ORDER — LACTATED RINGERS IR SOLN
Status: DC | PRN
  Administered 2020-06-14: 1000 mL

## 2020-06-14 MED ORDER — SODIUM CHLORIDE 0.9 % IV SOLN: INTRAVENOUS | Status: DC

## 2020-06-14 MED ORDER — LACTATED RINGERS IV SOLN: INTRAVENOUS | Status: DC | PRN

## 2020-06-14 MED ORDER — ONDANSETRON HCL 4 MG/2ML IJ SOLN: INTRAMUSCULAR | Status: DC | PRN

## 2020-06-14 MED ORDER — BUPIVACAINE-EPINEPHRINE (PF) 0.5% -1:200000 IJ SOLN
INTRAMUSCULAR | Status: AC
  Filled 2020-06-14: qty 30

## 2020-06-14 MED ORDER — MIDAZOLAM HCL 5 MG/5ML IJ SOLN
INTRAMUSCULAR | Status: DC | PRN
  Administered 2020-06-14: 2 mg via INTRAVENOUS

## 2020-06-14 MED ORDER — PROPOFOL 10 MG/ML IV BOLUS
INTRAVENOUS | Status: DC | PRN
  Administered 2020-06-14: 180 mg via INTRAVENOUS

## 2020-06-14 MED ORDER — PROPOFOL 10 MG/ML IV BOLUS
INTRAVENOUS | Status: AC
  Filled 2020-06-14: qty 20

## 2020-06-14 MED ORDER — DIPHENHYDRAMINE HCL 50 MG/ML IJ SOLN: 25.0000 mg | Freq: Four times a day (QID) | INTRAMUSCULAR | Status: DC | PRN

## 2020-06-14 MED ORDER — LIDOCAINE 2% (20 MG/ML) 5 ML SYRINGE
INTRAMUSCULAR | Status: AC
  Filled 2020-06-14: qty 5

## 2020-06-14 MED ORDER — MORPHINE SULFATE (PF) 2 MG/ML IV SOLN: 2.0000 mg | INTRAVENOUS | Status: DC | PRN

## 2020-06-14 MED ORDER — ROCURONIUM BROMIDE 10 MG/ML (PF) SYRINGE
PREFILLED_SYRINGE | INTRAVENOUS | Status: AC
  Filled 2020-06-14: qty 10

## 2020-06-14 MED ORDER — OXYCODONE HCL 5 MG PO TABS
5.0000 mg | ORAL_TABLET | ORAL | Status: DC | PRN
  Administered 2020-06-14 – 2020-06-15 (×3): 5 mg via ORAL
  Filled 2020-06-14 (×3): qty 1

## 2020-06-14 MED ORDER — POLYETHYLENE GLYCOL 3350 17 G PO PACK: 17.0000 g | PACK | Freq: Every day | ORAL | Status: DC | PRN

## 2020-06-14 MED ORDER — PROPOFOL 500 MG/50ML IV EMUL
INTRAVENOUS | Status: DC | PRN
  Administered 2020-06-14: 75 ug/kg/min via INTRAVENOUS

## 2020-06-14 MED ORDER — IOHEXOL 300 MG/ML  SOLN
INTRAMUSCULAR | Status: DC | PRN
  Administered 2020-06-14: 5 mL

## 2020-06-14 MED ORDER — SODIUM CHLORIDE 0.9 % IV SOLN
2.0000 g | Freq: Once | INTRAVENOUS | Status: AC
  Administered 2020-06-14: 2 g via INTRAVENOUS
  Filled 2020-06-14: qty 20

## 2020-06-14 MED ORDER — DEXAMETHASONE SODIUM PHOSPHATE 10 MG/ML IJ SOLN
INTRAMUSCULAR | Status: AC
  Filled 2020-06-14: qty 1

## 2020-06-14 MED ORDER — LIDOCAINE 2% (20 MG/ML) 5 ML SYRINGE
INTRAMUSCULAR | Status: DC | PRN
  Administered 2020-06-14: 1.5 mL/h via INTRAVENOUS

## 2020-06-14 MED ORDER — GABAPENTIN 300 MG PO CAPS
300.0000 mg | ORAL_CAPSULE | ORAL | Status: AC
  Administered 2020-06-14: 300 mg via ORAL
  Filled 2020-06-14: qty 1

## 2020-06-14 MED ORDER — SCOPOLAMINE 1 MG/3DAYS TD PT72
MEDICATED_PATCH | TRANSDERMAL | Status: AC
  Filled 2020-06-14: qty 1

## 2020-06-14 MED ORDER — ONDANSETRON 4 MG PO TBDP: 4.0000 mg | ORAL_TABLET | Freq: Four times a day (QID) | ORAL | Status: DC | PRN

## 2020-06-14 MED ORDER — METOPROLOL TARTRATE 5 MG/5ML IV SOLN: 5.0000 mg | Freq: Four times a day (QID) | INTRAVENOUS | Status: DC | PRN

## 2020-06-14 MED ORDER — IOHEXOL 300 MG/ML  SOLN
100.0000 mL | Freq: Once | INTRAMUSCULAR | Status: AC | PRN
  Administered 2020-06-14: 100 mL via INTRAVENOUS

## 2020-06-14 MED ORDER — SIMETHICONE 80 MG PO CHEW: 40.0000 mg | CHEWABLE_TABLET | Freq: Four times a day (QID) | ORAL | Status: DC | PRN

## 2020-06-14 MED ORDER — PHENYLEPHRINE HCL (PRESSORS) 10 MG/ML IV SOLN
INTRAVENOUS | Status: DC | PRN
  Administered 2020-06-14: 80 ug via INTRAVENOUS
  Administered 2020-06-14: 120 ug via INTRAVENOUS
  Administered 2020-06-14: 80 ug via INTRAVENOUS

## 2020-06-14 MED ORDER — DEXAMETHASONE SODIUM PHOSPHATE 10 MG/ML IJ SOLN
INTRAMUSCULAR | Status: DC | PRN
  Administered 2020-06-14: 8 mg via INTRAVENOUS

## 2020-06-14 MED ORDER — LIDOCAINE 2% (20 MG/ML) 5 ML SYRINGE
INTRAMUSCULAR | Status: DC | PRN
  Administered 2020-06-14: 100 mg via INTRAVENOUS

## 2020-06-14 MED ORDER — METHOCARBAMOL 500 MG PO TABS: 500.0000 mg | ORAL_TABLET | Freq: Four times a day (QID) | ORAL | Status: DC | PRN

## 2020-06-14 MED ORDER — HYDROMORPHONE HCL 1 MG/ML IJ SOLN
1.0000 mg | Freq: Once | INTRAMUSCULAR | Status: AC
  Administered 2020-06-14: 1 mg via INTRAVENOUS
  Filled 2020-06-14: qty 1

## 2020-06-14 MED ORDER — PANTOPRAZOLE SODIUM 40 MG IV SOLR
40.0000 mg | Freq: Every day | INTRAVENOUS | Status: DC
  Administered 2020-06-14: 40 mg via INTRAVENOUS
  Filled 2020-06-14: qty 40

## 2020-06-14 MED ORDER — LABETALOL HCL 5 MG/ML IV SOLN
INTRAVENOUS | Status: DC | PRN
  Administered 2020-06-14 (×2): 2.5 mg via INTRAVENOUS

## 2020-06-14 MED ORDER — TRAMADOL HCL 50 MG PO TABS: 50.0000 mg | ORAL_TABLET | Freq: Four times a day (QID) | ORAL | Status: DC | PRN

## 2020-06-14 MED ORDER — ESCITALOPRAM OXALATE 10 MG PO TABS
10.0000 mg | ORAL_TABLET | Freq: Every day | ORAL | Status: DC
  Administered 2020-06-15: 10 mg via ORAL
  Filled 2020-06-14: qty 1

## 2020-06-14 MED ORDER — DIPHENHYDRAMINE HCL 25 MG PO CAPS: 25.0000 mg | ORAL_CAPSULE | Freq: Four times a day (QID) | ORAL | Status: DC | PRN

## 2020-06-14 MED ORDER — SODIUM CHLORIDE 0.9 % IV SOLN
2.0000 g | Freq: Once | INTRAVENOUS | Status: AC
  Administered 2020-06-15: 2 g via INTRAVENOUS
  Filled 2020-06-14: qty 2

## 2020-06-14 MED ORDER — ROCURONIUM BROMIDE 10 MG/ML (PF) SYRINGE
PREFILLED_SYRINGE | INTRAVENOUS | Status: DC | PRN
  Administered 2020-06-14: 60 mg via INTRAVENOUS

## 2020-06-14 MED ORDER — SCOPOLAMINE 1 MG/3DAYS TD PT72
1.0000 | MEDICATED_PATCH | TRANSDERMAL | Status: DC
  Administered 2020-06-14: 1.5 mg via TRANSDERMAL

## 2020-06-14 MED ORDER — SCOPOLAMINE 1 MG/3DAYS TD PT72
MEDICATED_PATCH | TRANSDERMAL | Status: DC | PRN
  Administered 2020-06-14: 1 via TRANSDERMAL

## 2020-06-14 MED ORDER — ACETAMINOPHEN 325 MG PO TABS
650.0000 mg | ORAL_TABLET | Freq: Four times a day (QID) | ORAL | Status: DC | PRN
  Administered 2020-06-14 – 2020-06-15 (×2): 650 mg via ORAL
  Filled 2020-06-14 (×2): qty 2

## 2020-06-14 MED ORDER — ACETAMINOPHEN 500 MG PO TABS
1000.0000 mg | ORAL_TABLET | ORAL | Status: AC
  Administered 2020-06-14: 1000 mg via ORAL
  Filled 2020-06-14: qty 2

## 2020-06-14 MED ORDER — FENTANYL CITRATE (PF) 100 MCG/2ML IJ SOLN
INTRAMUSCULAR | Status: DC | PRN
  Administered 2020-06-14 (×2): 100 ug via INTRAVENOUS
  Administered 2020-06-14 (×2): 50 ug via INTRAVENOUS

## 2020-06-14 MED ORDER — ACETAMINOPHEN 650 MG RE SUPP: 650.0000 mg | Freq: Four times a day (QID) | RECTAL | Status: DC | PRN

## 2020-06-14 MED ORDER — SUGAMMADEX SODIUM 500 MG/5ML IV SOLN
INTRAVENOUS | Status: DC | PRN
  Administered 2020-06-14: 300 mg via INTRAVENOUS

## 2020-06-14 MED ORDER — ONDANSETRON HCL 4 MG/2ML IJ SOLN: 4.0000 mg | Freq: Four times a day (QID) | INTRAMUSCULAR | Status: DC | PRN

## 2020-06-14 MED ORDER — ONDANSETRON HCL 4 MG/2ML IJ SOLN
4.0000 mg | Freq: Once | INTRAMUSCULAR | Status: AC
  Administered 2020-06-14: 4 mg via INTRAVENOUS
  Filled 2020-06-14: qty 2

## 2020-06-14 MED ORDER — SUGAMMADEX SODIUM 500 MG/5ML IV SOLN
INTRAVENOUS | Status: AC
  Filled 2020-06-14: qty 5

## 2020-06-14 MED ORDER — MORPHINE SULFATE (PF) 4 MG/ML IV SOLN
4.0000 mg | Freq: Once | INTRAVENOUS | Status: AC
  Administered 2020-06-14: 4 mg via INTRAVENOUS
  Filled 2020-06-14: qty 1

## 2020-06-14 MED ORDER — SODIUM CHLORIDE 0.9 % IV BOLUS
1000.0000 mL | Freq: Once | INTRAVENOUS | Status: AC
  Administered 2020-06-14: 1000 mL via INTRAVENOUS

## 2020-06-14 MED ORDER — ONDANSETRON HCL 4 MG/2ML IJ SOLN: 4.0000 mg | Freq: Once | INTRAMUSCULAR | Status: DC | PRN

## 2020-06-14 MED ORDER — PHENYLEPHRINE 40 MCG/ML (10ML) SYRINGE FOR IV PUSH (FOR BLOOD PRESSURE SUPPORT)
PREFILLED_SYRINGE | INTRAVENOUS | Status: AC
  Filled 2020-06-14: qty 10

## 2020-06-14 MED ORDER — ONDANSETRON HCL 4 MG/2ML IJ SOLN
INTRAMUSCULAR | Status: AC
  Filled 2020-06-14: qty 2

## 2020-06-14 MED ORDER — 0.9 % SODIUM CHLORIDE (POUR BTL) OPTIME
TOPICAL | Status: DC | PRN
  Administered 2020-06-14: 1000 mL

## 2020-06-14 MED ORDER — FENTANYL CITRATE (PF) 250 MCG/5ML IJ SOLN
INTRAMUSCULAR | Status: AC
  Filled 2020-06-14: qty 5

## 2020-06-14 MED ORDER — ENOXAPARIN SODIUM 40 MG/0.4ML ~~LOC~~ SOLN
40.0000 mg | SUBCUTANEOUS | Status: DC
  Administered 2020-06-14 – 2020-06-15 (×2): 40 mg via SUBCUTANEOUS
  Filled 2020-06-14 (×2): qty 0.4

## 2020-06-14 MED ORDER — MIDAZOLAM HCL 2 MG/2ML IJ SOLN
INTRAMUSCULAR | Status: AC
  Filled 2020-06-14: qty 2

## 2020-06-14 MED ORDER — BUPIVACAINE-EPINEPHRINE 0.5% -1:200000 IJ SOLN
INTRAMUSCULAR | Status: DC | PRN
  Administered 2020-06-14: 30 mL

## 2020-06-14 SURGICAL SUPPLY — 38 items
ADH SKN CLS APL DERMABOND .7 (GAUZE/BANDAGES/DRESSINGS) ×1
APL PRP STRL LF DISP 70% ISPRP (MISCELLANEOUS) ×2
APPLIER CLIP ROT 10 11.4 M/L (STAPLE) ×2
APR CLP MED LRG 11.4X10 (STAPLE) ×1
BAG SPEC RTRVL LRG 6X4 10 (ENDOMECHANICALS) ×1
CABLE HIGH FREQUENCY MONO STRZ (ELECTRODE) ×2 IMPLANT
CHLORAPREP W/TINT 26 (MISCELLANEOUS) ×4 IMPLANT
CLIP APPLIE ROT 10 11.4 M/L (STAPLE) ×1 IMPLANT
COVER MAYO STAND STRL (DRAPES) ×2 IMPLANT
COVER SURGICAL LIGHT HANDLE (MISCELLANEOUS) ×2 IMPLANT
COVER WAND RF STERILE (DRAPES) IMPLANT
DECANTER SPIKE VIAL GLASS SM (MISCELLANEOUS) ×2 IMPLANT
DERMABOND ADVANCED (GAUZE/BANDAGES/DRESSINGS) ×1
DERMABOND ADVANCED .7 DNX12 (GAUZE/BANDAGES/DRESSINGS) IMPLANT
DRAPE C-ARM 42X120 X-RAY (DRAPES) ×2 IMPLANT
ELECT REM PT RETURN 15FT ADLT (MISCELLANEOUS) ×2 IMPLANT
GAUZE SPONGE 2X2 8PLY STRL LF (GAUZE/BANDAGES/DRESSINGS) ×1 IMPLANT
GLOVE SURG ORTHO 8.0 STRL STRW (GLOVE) ×2 IMPLANT
GOWN STRL REUS W/TWL XL LVL3 (GOWN DISPOSABLE) ×4 IMPLANT
HEMOSTAT SURGICEL 4X8 (HEMOSTASIS) IMPLANT
KIT BASIN OR (CUSTOM PROCEDURE TRAY) ×2 IMPLANT
KIT TURNOVER KIT A (KITS) ×1 IMPLANT
PENCIL SMOKE EVACUATOR (MISCELLANEOUS) IMPLANT
POUCH SPECIMEN RETRIEVAL 10MM (ENDOMECHANICALS) ×2 IMPLANT
SCISSORS LAP 5X35 DISP (ENDOMECHANICALS) ×2 IMPLANT
SET CHOLANGIOGRAPH MIX (MISCELLANEOUS) ×2 IMPLANT
SET IRRIG TUBING LAPAROSCOPIC (IRRIGATION / IRRIGATOR) ×2 IMPLANT
SET TUBE SMOKE EVAC HIGH FLOW (TUBING) ×1 IMPLANT
SLEEVE XCEL OPT CAN 5 100 (ENDOMECHANICALS) ×2 IMPLANT
SPONGE GAUZE 2X2 STER 10/PKG (GAUZE/BANDAGES/DRESSINGS)
STRIP CLOSURE SKIN 1/2X4 (GAUZE/BANDAGES/DRESSINGS) IMPLANT
SUT MNCRL AB 4-0 PS2 18 (SUTURE) ×2 IMPLANT
TOWEL OR 17X26 10 PK STRL BLUE (TOWEL DISPOSABLE) ×2 IMPLANT
TOWEL OR NON WOVEN STRL DISP B (DISPOSABLE) ×2 IMPLANT
TRAY LAPAROSCOPIC (CUSTOM PROCEDURE TRAY) ×2 IMPLANT
TROCAR BLADELESS OPT 5 100 (ENDOMECHANICALS) ×2 IMPLANT
TROCAR XCEL BLUNT TIP 100MML (ENDOMECHANICALS) ×2 IMPLANT
TROCAR XCEL NON-BLD 11X100MML (ENDOMECHANICALS) ×2 IMPLANT

## 2020-06-14 NOTE — ED Notes (Signed)
Report given to [employer], 3E

## 2020-06-14 NOTE — Op Note (Signed)
Procedure Note  Pre-operative Diagnosis:  Acute cholecystitis, cholelithiasis  Post-operative Diagnosis:  same  Surgeon:  Darnell Level, MD  Assistant:  none   Procedure:  Laparoscopic cholecystectomy with intra-operative cholangiography  Anesthesia:  General  Estimated Blood Loss:  minimal  Drains: none         Specimen: gallbladder to pathology  Indications:  Sierra Flores is a 44yo female PMH anxiety who presented to [facility] last night with worsening abdominal pain. States that this episode began Sunday. She reports experiencing upper abdominal pain, nausea, and vomiting. Pain waxed and waned until last night when the pain became more constant and severe. She tried take tums and miralax with no relief. Denies fever, chills, CP, SOB. Symptoms seem to be worse with PO intake. States that she has had similar symptoms at least twice over the last month, but the symptoms resolved without intervention. Due to persistent symptoms she decided to come to the ED.  Work up included CT scan which shows edematous gallbladder wall with cholelithiasis, consistent with acute cholecystitis. WBC 18. LFTs and lipase WNL.  Patient now comes to surgery for cholecystectomy.  Procedure Details:  The patient was seen in the pre-op holding area. The risks, benefits, complications, treatment options, and expected outcomes were previously discussed with the patient. The patient agreed with the proposed plan and has signed the informed consent form.  The patient was transported to operating room #1 at the [facility]. The patient was placed in the supine position on the operating room table. Following induction of general anesthesia, the abdomen was prepped and draped in the usual aseptic fashion.  An incision was made in the skin near the umbilicus. The midline fascia was incised and the peritoneal cavity was entered and a Hasson cannula was introduced under direct vision. The cannula was secured with a  0-Vicryl pursestring suture. Pneumoperitoneum was established with carbon dioxide. Additional cannulae were introduced under direct vision along the right costal margin in the midline, mid-clavicular line, and anterior axillary line.   The gallbladder was identified and aspirated.  The wall was markedly thickened and edematous.  The fundus was grasped and retracted cephalad. Adhesions were taken down bluntly and the electrocautery was utilized as needed, taking care not to involve any adjacent structures. The infundibulum was grasped and retracted laterally, exposing the peritoneum overlying the triangle of Calot. The peritoneum was incised and structures exposed with blunt dissection. The cystic duct was clearly identified, bluntly dissected circumferentially, and clipped at the neck of the gallbladder.  An incision was made in the cystic duct and the cholangiogram catheter introduced. The catheter was secured using an ligaclip.  Real-time cholangiography was performed using C-arm fluoroscopy.  There was rapid filling of a normal caliber common bile duct.  There was reflux of contrast into the left and right hepatic ductal systems.  There was free flow distally into the duodenum without filling defect or obstruction.  The catheter was removed from the peritoneal cavity.  The cystic duct was then ligated with ligaclips and divided. The cystic artery was identified, dissected circumferentially, ligated with ligaclips, and divided.  The gallbladder was dissected away from the gallbladder bed using the electrocautery for hemostasis. The gallbladder was completely removed from the liver and placed into an endocatch bag. The gallbladder was removed in the endocatch bag through the umbilical port site and submitted to pathology for review.  The right upper quadrant was irrigated and the gallbladder bed was inspected. Hemostasis was achieved with the electrocautery.  Cannulae were removed under direct vision  and good hemostasis was noted. Pneumoperitoneum was released and the majority of the carbon dioxide evacuated. The umbilical wound was irrigated and the fascia was then closed with the pursestring suture.  Local anesthetic was infiltrated at all port sites. Skin incisions were closed with 4-0 Monocril subcuticular sutures and Dermabond was applied.  Instrument, sponge, and needle counts were correct at the conclusion of the case.  The patient was awakened from anesthesia and brought to the recovery room in stable condition.  The patient tolerated the procedure well.   Darnell Level, MD [facility] Office: [phone]

## 2020-06-14 NOTE — ED Notes (Signed)
Spoke to LAB regarding pt's covid swab. There was some confusion regarding whether or not pt had actually been swabbed (no evidence found in ED narrator). Provider put in new orders. Lab will process collection from last night and submit results.

## 2020-06-14 NOTE — Discharge Instructions (Signed)
[facility] ° °Please arrive at least 30 min before your appointment to complete your check in paperwork.  If you are unable to arrive 30 min prior to your appointment time we may have to cancel or reschedule you. °LAPAROSCOPIC SURGERY: POST OP INSTRUCTIONS °Always review your discharge instruction sheet given to you by the facility where your surgery was performed. °IF YOU HAVE DISABILITY OR FAMILY LEAVE FORMS, YOU MUST BRING THEM TO THE OFFICE FOR PROCESSING.   °DO NOT GIVE THEM TO YOUR DOCTOR. ° °PAIN CONTROL ° °1. First take acetaminophen (Tylenol) AND/or ibuprofen (Advil) to control your pain after surgery.  Follow directions on package.  Taking acetaminophen (Tylenol) and/or ibuprofen (Advil) regularly after surgery will help to control your pain and lower the amount of prescription pain medication you may need.  You should not take more than 4,000 mg (4 grams) of acetaminophen (Tylenol) in 24 hours.  You should not take ibuprofen (Advil), aleve, motrin, naprosyn or other NSAIDS if you have a history of stomach ulcers or chronic kidney disease.  °2. A prescription for pain medication may be given to you upon discharge.  Take your pain medication as prescribed, if you still have uncontrolled pain after taking acetaminophen (Tylenol) or ibuprofen (Advil). °3. Use ice packs to help control pain. °4. If you need a refill on your pain medication, please contact your pharmacy.  They will contact our office to request authorization. Prescriptions will not be filled after 5pm or on week-ends. ° °HOME MEDICATIONS °5. Take your usually prescribed medications unless otherwise directed. ° °DIET °6. You should follow a light diet the first few days after arrival home.  Be sure to include lots of fluids daily. Avoid fatty, fried foods.  ° °CONSTIPATION °7. It is common to experience some constipation after surgery and if you are taking pain medication.  Increasing fluid intake and taking a stool  softener (such as Colace) will usually help or prevent this problem from occurring.  A mild laxative (Milk of Magnesia or Miralax) should be taken according to package instructions if there are no bowel movements after 48 hours. ° °WOUND/INCISION CARE °8. Most patients will experience some swelling and bruising in the area of the incisions.  Ice packs will help.  Swelling and bruising can take several days to resolve.  °9. Unless discharge instructions indicate otherwise, follow guidelines below  °a. STERI-STRIPS - you may remove your outer bandages 48 hours after surgery, and you may shower at that time.  You have steri-strips (small skin tapes) in place directly over the incision.  These strips should be left on the skin for 7-10 days.   °b. DERMABOND/SKIN GLUE - you may shower in 24 hours.  The glue will flake off over the next 2-3 weeks. °10. Any sutures or staples will be removed at the office during your follow-up visit. ° °ACTIVITIES °11. You may resume regular (light) daily activities beginning the next day--such as daily self-care, walking, climbing stairs--gradually increasing activities as tolerated.  You may have sexual intercourse when it is comfortable.  Refrain from any heavy lifting or straining until approved by your doctor. °a. You may drive when you are no longer taking prescription pain medication, you can comfortably wear a seatbelt, and you can safely maneuver your car and apply brakes. ° °FOLLOW-UP °12. You should see your doctor in the office for a follow-up appointment approximately 2-3 weeks after your surgery.  You should have been given your post-op/follow-up appointment when   your surgery was scheduled.  If you did not receive a post-op/follow-up appointment, make sure that you call for this appointment within a day or two after you arrive home to insure a convenient appointment time. ° °OTHER INSTRUCTIONS ° °WHEN TO CALL YOUR DOCTOR: °1. Fever over 101.0 °2. Inability to  urinate °3. Continued bleeding from incision. °4. Increased pain, redness, or drainage from the incision. °5. Increasing abdominal pain ° °The clinic staff is available to answer your questions during regular business hours.  Please don’t hesitate to call and ask to speak to one of the nurses for clinical concerns.  If you have a medical emergency, go to the nearest emergency room or call 911.  A surgeon from [facility] is always on call at the hospital. °[address], [address], [place], [place]  [address] ? [address] [address], [place],    [address] °[phone] ? [phone] ? FAX [phone] ° ° ° °

## 2020-06-14 NOTE — ED Provider Notes (Signed)
[facility]-EMERGENCY DEPT Provider Note   CSN: [id number] Arrival date & time: 06/13/20  2349     History Chief Complaint  Patient presents with  . Abdominal Pain    Sierra Flores is a 44 y.o. female with a history of anxiety who presents to the emergency department with a chief complaint of abdominal pain.  The patient endorses constant, waxing and waning, nonradiating, cramping upper abdominal pain that began 3 days ago.  She reports associated nausea and nonbloody, nonbilious vomiting.  She has not vomited twice in the last 24 hours.  She has attempted to take Tums and MiraLAX with no improvement in her symptoms.  Symptoms seem to be worse when eating some foods, but she is unable to correlate which foods may make her symptoms better or worse.  She also reports that she has been feeling constipated and is having urinary hesitancy.   She denies fever, chills, diarrhea, cough, chest pain, shortness of breath, back pain, vaginal bleeding, vaginal discharge, flank pain, or hematuria.  She reports a history of similar pain several weeks ago that spontaneously resolved.  She underwent a bilateral breast reduction on 04/17/20.  No history of abdominal surgery.  She has no concerns for pregnancy at this time.  The history is provided by the patient and medical records. No language interpreter was used.       Past Medical History:  Diagnosis Date  . Anxiety     Patient Active Problem List   Diagnosis Date Noted  . Neck pain 10/12/2019  . Back pain 10/12/2019  . 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 BREAST REDUCTION SURGERY Bilateral 04/17/2020   Procedure: MAMMARY REDUCTION  (BREAST);  Surgeon: Allena Napoleon, MD;  Location: [facility];  Service: Plastics;  Laterality: Bilateral;  2.5 hours, please     OB History   No obstetric history on file.     No family history on  file.  Social History   Tobacco Use  . Smoking status: Current Some Day Smoker  . Smokeless tobacco: Never Used  Vaping Use  . Vaping Use: Never used  Substance Use Topics  . Alcohol use: Yes    Comment: 1-2 a week   . Drug use: Yes    Types: Marijuana    Comment: 1 week ago    Home Medications Prior to Admission medications   Medication Sig Start Date End Date Taking? Authorizing Provider  escitalopram (LEXAPRO) 10 MG tablet Take 10 mg by mouth daily. 10/04/19  Yes [provider]  levothyroxine (SYNTHROID) 50 MCG tablet Take 50 mcg by mouth daily. 05/29/20  Yes [provider]  rizatriptan (MAXALT) 10 MG tablet Take 10 mg by mouth 2 (two) times daily as needed. 07/19/19  Yes [provider]  ondansetron (ZOFRAN) 4 MG tablet Take 1 tablet (4 mg total) by mouth every 8 (eight) hours as needed for nausea or vomiting. Patient not taking: Reported on 06/14/2020 04/06/20   Scheeler, Kermit Balo, PA-C    Allergies    Patient has no known allergies.  Review of Systems   Review of Systems  Constitutional: Negative for activity change, chills and fever.  HENT: Negative for congestion and sore throat.   Respiratory: Negative for cough, shortness of breath and wheezing.   Cardiovascular: Negative for chest pain, palpitations and leg swelling.  Gastrointestinal: Positive for abdominal pain, constipation, nausea and vomiting. Negative for anal bleeding, blood  in stool and diarrhea.  Genitourinary: Negative for dysuria, flank pain, frequency, urgency, vaginal bleeding, vaginal discharge and vaginal pain.       Urinary hesitancy  Musculoskeletal: Negative for back pain, myalgias, neck pain and neck stiffness.  Skin: Negative for rash.  Allergic/Immunologic: Negative for immunocompromised state.  Neurological: Negative for dizziness, seizures, syncope, weakness, numbness and headaches.  Psychiatric/Behavioral: Negative for confusion.    Physical Exam Updated  Vital Signs BP (!) 143/90   Pulse 81   Temp 98 F (36.7 C) (Oral)   Resp 16   Ht 5\' 5"  (1.651 m)   Wt 97.5 kg   LMP 05/30/2020 Comment: negative beta HCG 06/14/20  SpO2 92%   BMI 35.78 kg/m   Physical Exam Vitals and nursing note reviewed.  Constitutional:      General: She is not in acute distress.    Appearance: She is not ill-appearing, toxic-appearing or diaphoretic.     Comments: Nontoxic-appearing  HENT:     Head: Normocephalic.  Eyes:     Conjunctiva/sclera: Conjunctivae normal.  Cardiovascular:     Rate and Rhythm: Normal rate and regular rhythm.     Heart sounds: No murmur heard.  No friction rub. No gallop.   Pulmonary:     Effort: Pulmonary effort is normal. No respiratory distress.     Breath sounds: No stridor. No wheezing, rhonchi or rales.  Chest:     Chest wall: No tenderness.  Abdominal:     General: There is no distension.     Palpations: Abdomen is soft. There is no mass.     Tenderness: There is abdominal tenderness. There is no right CVA tenderness, left CVA tenderness, guarding or rebound.     Hernia: No hernia is present.     Comments: Diffusely tender to palpation throughout the abdomen.  Abdomen is soft and nondistended.  Normoactive bowel sounds.  Musculoskeletal:     Cervical back: Neck supple.     Right lower leg: No edema.     Left lower leg: No edema.  Skin:    General: Skin is warm.     Findings: No rash.  Neurological:     Mental Status: She is alert.  Psychiatric:        Behavior: Behavior normal.   ;  ED Results / Procedures / Treatments   Labs (all labs ordered are listed, but only abnormal results are displayed) Labs Reviewed  COMPREHENSIVE METABOLIC PANEL - Abnormal; Notable for the following components:      Result Value   Sodium 133 (*)    Glucose, Bld 145 (*)    All other components within normal limits  URINALYSIS, ROUTINE W REFLEX MICROSCOPIC - Abnormal; Notable for the following components:   APPearance HAZY (*)     Hgb urine dipstick SMALL (*)    Ketones, ur 20 (*)    Protein, ur 30 (*)    Bacteria, UA RARE (*)    All other components within normal limits  CBC WITH DIFFERENTIAL/PLATELET - Abnormal; Notable for the following components:   WBC 18.0 (*)    RBC 5.13 (*)    MCV 76.6 (*)    MCH 24.6 (*)    RDW 16.3 (*)    Neutro Abs 15.8 (*)    All other components within normal limits  RESPIRATORY PANEL BY PCR  LIPASE, BLOOD  MAGNESIUM  I-STAT BETA HCG BLOOD, ED ([facility], [facility], [facility] ONLY)    EKG None  Radiology CT ABDOMEN PELVIS W CONTRAST  Result Date: 06/14/2020 CLINICAL DATA:  Abdominal pain EXAM: CT ABDOMEN AND PELVIS WITH CONTRAST TECHNIQUE: Multidetector CT imaging of the abdomen and pelvis was performed using the standard protocol following bolus administration of intravenous contrast. CONTRAST:  100mL OMNIPAQUE IOHEXOL 300 MG/ML  SOLN COMPARISON:  None. FINDINGS: LOWER CHEST: Normal. HEPATOBILIARY: Normal hepatic contours. No intra- or extrahepatic biliary dilatation. Edematous gallbladder wall with cholelithiasis. PANCREAS: Normal pancreas. No ductal dilatation or peripancreatic fluid collection. SPLEEN: Normal. ADRENALS/URINARY TRACT: The adrenal glands are normal. No hydronephrosis, nephroureterolithiasis or solid renal mass. The urinary bladder is normal for degree of distention STOMACH/BOWEL: There is no hiatal hernia. Normal duodenal course and caliber. No small bowel dilatation or inflammation. No focal colonic abnormality. Normal appendix. VASCULAR/LYMPHATIC: Normal course and caliber of the major abdominal vessels. No abdominal or pelvic lymphadenopathy. REPRODUCTIVE: Normal uterus. No adnexal mass. MUSCULOSKELETAL. No bony spinal canal stenosis or focal osseous abnormality. OTHER: None. IMPRESSION: Edematous gallbladder wall with cholelithiasis, consistent with acute cholecystitis. Electronically Signed   By: Deatra RobinsonKevin  Herman M.D.   On: 06/14/2020 03:09    Procedures Procedures (including  critical care time)  Medications Ordered in ED Medications  sodium chloride (PF) 0.9 % injection (has no administration in time range)  morphine 4 MG/ML injection 4 mg (4 mg Intravenous Given 06/14/20 0036)  ondansetron (ZOFRAN) injection 4 mg (4 mg Intravenous Given 06/14/20 0036)  sodium chloride 0.9 % bolus 1,000 mL (0 mLs Intravenous Stopped 06/14/20 0124)  iohexol (OMNIPAQUE) 300 MG/ML solution 100 mL (100 mLs Intravenous Contrast Given 06/14/20 0248)  cefTRIAXone (ROCEPHIN) 2 g in sodium chloride 0.9 % 100 mL IVPB (0 g Intravenous Stopped 06/14/20 0426)  HYDROmorphone (DILAUDID) injection 1 mg (1 mg Intravenous Given 06/14/20 0422)    ED Course  I have reviewed the triage vital signs and the nursing notes.  Pertinent labs & imaging results that were available during my care of the patient were reviewed by me and considered in my medical decision making (see chart for details).  Clinical Course as of Jun 14 546  Wed Jun 14, 2020  0100 Patient seen and reevaluated after receiving pain medication.  Her husband reports that her pain now seems to be radiating to her right shoulder.  She is now focally tender to palpation in the right upper and lower quadrant.  She does have a positive Murphy sign, which is concerning for cholecystitis.  However, given the pain is not localized and patient has normal hepatic function panel, will order CT abdomen pelvis for further evaluation of her symptoms.   [MM]    Clinical Course User Index [MM] Kersti Scavone, Coral ElseMia A, PA-C   MDM Rules/Calculators/A&P                          44 year old female resenting with abdominal pain, nausea, and vomiting.   This patient presents to the ED for concern of  cholecystitis this involves an extensive number of treatment options, and is a complaint that carries with it a high risk of complications and morbidity.  The differential diagnosis includes appendicitis, pyelonephritis, ileus secondary to recent surgery, small  bowel obstruction, renal calculus, or ovarian torsion.      Lab Tests:    I Ordered, reviewed, and interpreted labs, which included  CBC, metabolic panel, lipase, urinalysis, and COVID-19 test that showed a leukocytosis that suggest was suggestive of appendicitis versus cholecystitis.  When the patient's abdominal exam was repeated after pain control, she did continue to  have right-sided upper and lower abdominal pain.   Medicines ordered:    I ordered morphine and Dilaudid for pain control and Zofran for nausea.  She was also given a dose of Rocephin for empiric treatment of cholecystitis.   Imaging Studies ordered:    I ordered imaging studies which included CT abdomen pelvis.  I independently visualized and interpreted imaging which showed  and edematous gallbladder wall and cholelithiasis, concerning for acute cholecystitis.   Additional history obtained:    Previous records obtained and reviewed   Consultations Obtained:    I consulted  Dr. Donell Beers with general surgery who will plan to admit the patient. The patient appears reasonably stabilized for admission considering the current resources, flow, and capabilities available in the ED at this time, and I doubt any other [facility] requiring further screening and/or treatment in the ED prior to admission.    Final Clinical Impression(s) / ED Diagnoses Final diagnoses:  Acute cholecystitis    Rx / DC Orders ED Discharge Orders    None       Barkley Boards, PA-C 06/14/20 0548    Mesner, Barbara Cower, MD 06/14/20 2351

## 2020-06-14 NOTE — Anesthesia Preprocedure Evaluation (Addendum)
Anesthesia Evaluation  Patient identified by MRN, date of birth, ID band Patient awake    Reviewed: reviewed documented beta blocker date and time   Airway Mallampati: II  TM Distance: >3 FB Neck ROM: Full    Dental  (+) Teeth Intact   Pulmonary Current Smoker,    Pulmonary exam normal        Cardiovascular negative cardio ROS   Rhythm:Regular Rate:Normal     Neuro/Psych Anxiety    GI/Hepatic Neg liver ROS, Acute cholecystitis    Endo/Other  Hypothyroidism   Renal/GU negative Renal ROS     Musculoskeletal negative musculoskeletal ROS (+)   Abdominal (+)  Abdomen: soft. Bowel sounds: normal.  Peds  Hematology negative hematology ROS (+)   Anesthesia Other Findings   Reproductive/Obstetrics negative OB ROS                             Anesthesia Physical Anesthesia Plan  ASA: II  Anesthesia Plan: General   Post-op Pain Management:    Induction: Intravenous  PONV Risk Score and Plan: 2 and Ondansetron, Dexamethasone, Midazolam and Treatment may vary due to age or medical condition  Airway Management Planned: Mask and Oral ETT  Additional Equipment: None  Intra-op Plan:   Post-operative Plan: Extubation in OR  Informed Consent: I have reviewed the patients History and Physical, chart, labs and discussed the procedure including the risks, benefits and alternatives for the proposed anesthesia with the patient or authorized representative who has indicated his/her understanding and acceptance.     Dental advisory given  Plan Discussed with: CRNA  Anesthesia Plan Comments: (Lab Results      Component                Value               Date                      WBC                      18.0 (H)            06/14/2020                HGB                      12.6                06/14/2020                HCT                      39.3                06/14/2020                 MCV                      76.6 (L)            06/14/2020                PLT                      321                 06/14/2020  Lab Results      Component                Value               Date                      PREGTESTUR               NEGATIVE            04/17/2020                HCG                      <5.0                06/14/2020          )        Anesthesia Quick Evaluation

## 2020-06-14 NOTE — ED Notes (Signed)
Patient c/o lower abdominal cramping that started Sunday with nausea and vomiting.

## 2020-06-14 NOTE — Anesthesia Postprocedure Evaluation (Signed)
Anesthesia Post Note  Patient: Health visitor  Procedure(s) Performed: LAPAROSCOPIC CHOLECYSTECTOMY WITH INTRAOPERATIVE CHOLANGIOGRAM (N/A Abdomen)     Patient location during evaluation: PACU Anesthesia Type: General Level of consciousness: awake and alert Pain management: pain level controlled Vital Signs Assessment: post-procedure vital signs reviewed and stable Respiratory status: spontaneous breathing, nonlabored ventilation, respiratory function stable and patient connected to nasal cannula oxygen Cardiovascular status: blood pressure returned to baseline and stable Postop Assessment: no apparent nausea or vomiting Anesthetic complications: no   No complications documented.  Last Vitals:  Vitals:   06/14/20 1558 06/14/20 1658  BP: 135/85 140/86  Pulse: 82 87  Resp: 18 18  Temp: 36.6 C 36.6 C  SpO2: 91% 93%    Last Pain:  Vitals:   06/14/20 1658  TempSrc: Oral  PainSc:                  Nelle Don Tezra Mahr

## 2020-06-14 NOTE — Anesthesia Procedure Notes (Signed)
Procedure Name: Intubation Date/Time: 06/14/2020 11:14 AM Performed by: Lissa Morales, CRNA Pre-anesthesia Checklist: Patient identified, Emergency Drugs available, Suction available and Patient being monitored Patient Re-evaluated:Patient Re-evaluated prior to induction Oxygen Delivery Method: Circle system utilized Preoxygenation: Pre-oxygenation with 100% oxygen Induction Type: IV induction Ventilation: Mask ventilation without difficulty Laryngoscope Size: Mac and 4 Grade View: Grade II Tube type: Oral Number of attempts: 1 Airway Equipment and Method: Stylet and Oral airway Placement Confirmation: ETT inserted through vocal cords under direct vision,  positive ETCO2 and breath sounds checked- equal and bilateral Secured at: 21 cm Tube secured with: Tape Dental Injury: Teeth and Oropharynx as per pre-operative assessment  Comments: Increased cricoid to visualize cords

## 2020-06-14 NOTE — Interval H&P Note (Signed)
History and Physical Interval Note:  06/14/2020 10:02 AM  Sierra Flores  has presented today for surgery, with the diagnosis of acute cholecystitis.  The various methods of treatment have been discussed with the patient and family. After consideration of risks, benefits and other options for treatment, the patient has consented to    Procedure(s): LAPAROSCOPIC CHOLECYSTECTOMY WITH INTRAOPERATIVE CHOLANGIOGRAM (N/A) as a surgical intervention.    The patient's history has been reviewed, patient examined, no change in status, stable for surgery.  I have reviewed the patient's chart and labs.  Questions were answered to the patient's satisfaction.    Darnell Level, MD [facility] Office: [phone]   Darnell Level

## 2020-06-14 NOTE — ED Notes (Signed)
Patient is resting comfortably. 

## 2020-06-14 NOTE — ED Notes (Signed)
Patient has been swabbed with additional testing solution.

## 2020-06-14 NOTE — H&P (Signed)
[facility] Admission Note  [employer] 1976/03/28  [id number].    Requesting MD: Frederik Pear PA Chief Complaint/Reason for Consult: cholecystitis  HPI:  Sierra Flores is a 44yo female PMH anxiety who presented to [facility] last night with worsening abdominal pain. States that this episode began Sunday. She reports experiencing upper abdominal pain, nausea, and vomiting. Pain waxed and waned until last night when the pain became more constant and severe. She tried take tums and miralax with no relief. Denies fever, chills, CP, SOB. Symptoms seem to be worse with PO intake. States that she has had similar symptoms at least twice over the last month, but the symptoms resolved without intervention. Due to persistent symptoms she decided to come to the ED. ED work up included CT scan which shows edematous gallbladder wall with cholelithiasis, consistent with acute cholecystitis. WBC 18. LFTs and lipase WNL. General surgery asked to see.  She has had nothing to eat/drink today.  Abdominal surgical history: none Anticoagulants: none Nonsmoker Completed covid-19 vaccine 11/2019  Review of Systems  Constitutional: Negative.   Gastrointestinal: Positive for abdominal pain, nausea and vomiting.    All systems reviewed and otherwise negative except for as above  No family history on file.  Past Medical History:  Diagnosis Date  . Anxiety     Past Surgical History:  Procedure Laterality Date  . BREAST REDUCTION SURGERY Bilateral 04/17/2020   Procedure: MAMMARY REDUCTION  (BREAST);  Surgeon: Allena Napoleon, MD;  Location: [facility];  Service: Plastics;  Laterality: Bilateral;  2.5 hours, please    Social History:  reports that she has been smoking. She has never used smokeless tobacco. She reports current alcohol use. She reports current drug use. Drug: Marijuana.  Allergies: No Known Allergies  (Not in a hospital admission)   Prior to Admission  medications   Medication Sig Start Date End Date Taking? Authorizing Provider  escitalopram (LEXAPRO) 10 MG tablet Take 10 mg by mouth daily. 10/04/19  Yes [provider]  levothyroxine (SYNTHROID) 50 MCG tablet Take 50 mcg by mouth daily. 05/29/20  Yes [provider]  rizatriptan (MAXALT) 10 MG tablet Take 10 mg by mouth 2 (two) times daily as needed. 07/19/19  Yes [provider]  ondansetron (ZOFRAN) 4 MG tablet Take 1 tablet (4 mg total) by mouth every 8 (eight) hours as needed for nausea or vomiting. Patient not taking: Reported on 06/14/2020 04/06/20   Scheeler, Kermit Balo, PA-C    Blood pressure (!) 148/95, pulse 97, temperature 98 F (36.7 C), temperature source Oral, resp. rate 16, height 5\' 5"  (1.651 m), weight 97.5 kg, last menstrual period 05/30/2020, SpO2 95 %. Physical Exam: General: pleasant, WD/WN white female who is laying in bed in NAD HEENT: head is normocephalic, atraumatic.  Sclera are noninjected.  PERRL.  Ears and nose without any masses or lesions.  Mouth is pink and moist. Dentition fair Heart: regular, rate, and rhythm.  Normal s1,s2. No obvious murmurs, gallops, or rubs noted.  Palpable pedal pulses bilaterally  Lungs: CTAB, no wheezes, rhonchi, or rales noted.  Respiratory effort nonlabored Abd: soft, ND, +BS, no masses, hernias, or organomegaly. TTP RUQ and epigastric region MS: no BUE/BLE edema, calves soft and nontender Skin: warm and dry with no masses, lesions, or rashes Psych: A&Ox4 with an appropriate affect Neuro: cranial nerves grossly intact, equal strength in BUE/BLE bilaterally, normal speech, thought process intact  Results for orders placed or performed during the hospital encounter of 06/14/20 (  from the past 48 hour(s))  Lipase, blood     Status: None   Collection Time: 06/14/20 12:22 AM  Result Value Ref Range   Lipase 26 11 - 51 U/L    Comment: Performed at [facility], [address],  [place], [place] [address]  Comprehensive metabolic panel     Status: Abnormal   Collection Time: 06/14/20 12:22 AM  Result Value Ref Range   Sodium 133 (L) 135 - 145 mmol/L   Potassium 3.7 3.5 - 5.1 mmol/L   Chloride 100 98 - 111 mmol/L   CO2 23 22 - 32 mmol/L   Glucose, Bld 145 (H) 70 - 99 mg/dL    Comment: Glucose reference range applies only to samples taken after fasting for at least 8 hours.   BUN 7 6 - 20 mg/dL   Creatinine, Ser 6.04 0.44 - 1.00 mg/dL   Calcium 9.2 8.9 - 54.0 mg/dL   Total Protein 7.6 6.5 - 8.1 g/dL   Albumin 4.3 3.5 - 5.0 g/dL   AST 17 15 - 41 U/L   ALT 16 0 - 44 U/L   Alkaline Phosphatase 81 38 - 126 U/L   Total Bilirubin 0.5 0.3 - 1.2 mg/dL   GFR, Estimated >98 >11 mL/min    Comment: (NOTE) Calculated using the CKD-EPI Creatinine Equation (2021)    Anion gap 10 5 - 15    Comment: Performed at [facility], [address] [place], [place] [address]  CBC with Differential     Status: Abnormal   Collection Time: 06/14/20 12:26 AM  Result Value Ref Range   WBC 18.0 (H) 4.0 - 10.5 K/uL   RBC 5.13 (H) 3.87 - 5.11 MIL/uL   Hemoglobin 12.6 12.0 - 15.0 g/dL   HCT 29.5 36 - 46 %   MCV 76.6 (L) 80.0 - 100.0 fL   MCH 24.6 (L) 26.0 - 34.0 pg   MCHC 32.1 30.0 - 36.0 g/dL   RDW 62.1 (H) 30.8 - 65.7 %   Platelets 321 150 - 400 K/uL   nRBC 0.0 0.0 - 0.2 %   Neutrophils Relative % 89 %   Neutro Abs 15.8 (H) 1.7 - 7.7 K/uL   Lymphocytes Relative 7 %   Lymphs Abs 1.3 0.7 - 4.0 K/uL   Monocytes Relative 4 %   Monocytes Absolute 0.7 0.1 - 1.0 K/uL   Eosinophils Relative 0 %   Eosinophils Absolute 0.0 0.0 - 0.5 K/uL   Basophils Relative 0 %   Basophils Absolute 0.0 0.0 - 0.1 K/uL   Immature Granulocytes 0 %   Abs Immature Granulocytes 0.07 0.00 - 0.07 K/uL    Comment: Performed at [facility], [address] [place], [place] [address]  Magnesium     Status: None   Collection Time: 06/14/20 12:26 AM  Result Value Ref Range    Magnesium 1.8 1.7 - 2.4 mg/dL    Comment: Performed at [facility], [address] [place], [place] [address]  I-Stat beta hCG blood, ED     Status: None   Collection Time: 06/14/20 12:27 AM  Result Value Ref Range   I-stat hCG, quantitative <5.0 <5 mIU/mL   Comment 3            Comment:   GEST. AGE      CONC.  (mIU/mL)   <=1 WEEK        5 - 50     2 WEEKS  50 - 500     3 WEEKS       100 - 10,000     4 WEEKS     1,000 - 30,000        FEMALE AND NON-PREGNANT FEMALE:     LESS THAN 5 mIU/mL   Urinalysis, Routine w reflex microscopic Urine, Clean Catch     Status: Abnormal   Collection Time: 06/14/20 12:35 AM  Result Value Ref Range   Color, Urine YELLOW YELLOW   APPearance HAZY (A) CLEAR   Specific Gravity, Urine 1.020 1.005 - 1.030   pH 5.0 5.0 - 8.0   Glucose, UA NEGATIVE NEGATIVE mg/dL   Hgb urine dipstick SMALL (A) NEGATIVE   Bilirubin Urine NEGATIVE NEGATIVE   Ketones, ur 20 (A) NEGATIVE mg/dL   Protein, ur 30 (A) NEGATIVE mg/dL   Nitrite NEGATIVE NEGATIVE   Leukocytes,Ua NEGATIVE NEGATIVE   RBC / HPF 0-5 0 - 5 RBC/hpf   WBC, UA 0-5 0 - 5 WBC/hpf   Bacteria, UA RARE (A) NONE SEEN   Squamous Epithelial / LPF 0-5 0 - 5   Mucus PRESENT     Comment: Performed at [facility], [address], [place], [place] [address]   CT ABDOMEN PELVIS W CONTRAST  Result Date: 06/14/2020 CLINICAL DATA:  Abdominal pain EXAM: CT ABDOMEN AND PELVIS WITH CONTRAST TECHNIQUE: Multidetector CT imaging of the abdomen and pelvis was performed using the standard protocol following bolus administration of intravenous contrast. CONTRAST:  OMNIPAQUE IOHEXOL 300 MG/ML  SOLN COMPARISON:  None. FINDINGS: LOWER CHEST: Normal. HEPATOBILIARY: Normal hepatic contours. No intra- or extrahepatic biliary dilatation. Edematous gallbladder wall with cholelithiasis. PANCREAS: Normal pancreas. No ductal dilatation or peripancreatic fluid collection. SPLEEN: Normal.  ADRENALS/URINARY TRACT: The adrenal glands are normal. No hydronephrosis, nephroureterolithiasis or solid renal mass. The urinary bladder is normal for degree of distention STOMACH/BOWEL: There is no hiatal hernia. Normal duodenal course and caliber. No small bowel dilatation or inflammation. No focal colonic abnormality. Normal appendix. VASCULAR/LYMPHATIC: Normal course and caliber of the major abdominal vessels. No abdominal or pelvic lymphadenopathy. REPRODUCTIVE: Normal uterus. No adnexal mass. MUSCULOSKELETAL. No bony spinal canal stenosis or focal osseous abnormality. OTHER: None. IMPRESSION: Edematous gallbladder wall with cholelithiasis, consistent with acute cholecystitis. Electronically Signed   By: Deatra Robinson M.D.   On: 06/14/2020 03:09      Assessment/Plan Anxiety  Acute cholecystitis - Patient with clinical and radiographic findings consistent with acute cholecystitis. Admit to med-surg for observation. Will start IV rocephin and plan for cholecystectomy later today pending negative covid test. Keep NPO.  ID - rocephin VTE - SCDs, lovenox FEN - IVF, NPO Foley - none Follow up - TBD  Franne Forts, [facility] 06/14/2020, 7:56 AM Please see Amion for pager number during day hours 7:00am-4:30pm

## 2020-06-14 NOTE — Transfer of Care (Addendum)
Immediate Anesthesia Transfer of Care Note  Patient: Muriah Pentland  Procedure(s) Performed: LAPAROSCOPIC CHOLECYSTECTOMY WITH INTRAOPERATIVE CHOLANGIOGRAM (N/A Abdomen)  Patient Location: PACU  Anesthesia Type:General  Level of Consciousness: awake, alert , oriented and patient cooperative  Airway & Oxygen Therapy: Patient Spontanous Breathing and Patient connected to face mask oxygen  Post-op Assessment: Report given to RN, Post -op Vital signs reviewed and stable and Patient moving all extremities X 4  Post vital signs: stable  Last Vitals:  Vitals Value Taken Time  BP 135/77 06/14/20 1236  Temp 37.4 C 06/14/20 1236  Pulse 93 06/14/20 1242  Resp 16 06/14/20 1242  SpO2 97 % 06/14/20 1242  Vitals shown include unvalidated device data.  Last Pain:  Vitals:   06/14/20 1236  TempSrc:   PainSc: 0-No pain         Complications: No complications documented.

## 2020-06-15 ENCOUNTER — Encounter (HOSPITAL_COMMUNITY): Payer: Self-pay | Admitting: Surgery

## 2020-06-15 LAB — SURGICAL PATHOLOGY

## 2020-06-15 MED ORDER — OXYCODONE HCL 5 MG PO TABS
5.0000 mg | ORAL_TABLET | ORAL | 0 refills | Status: AC | PRN
Start: 2020-06-15 — End: ?

## 2020-06-15 NOTE — Discharge Summary (Signed)
Physician Discharge Summary [facility]  Patient ID: Sierra Flores MRN: [id number] DOB/AGE: 1976/07/11 44 y.o.  Admit date: 06/14/2020 Discharge date: 06/15/2020  Admission Diagnoses:  Acute cholecystitis, cholelithiasis  Discharge Diagnoses:  Principal Problem:   Cholelithiasis with cholecystitis Active Problems:   Acute cholecystitis   Discharged Condition: good  Hospital Course: Patient was admitted for observation following gallbladder surgery.  Post op course was uncomplicated.  Pain was well controlled.  Tolerated diet.  Patient was prepared for discharge home on POD#1.  Consults: None  Treatments: surgery: lap chole with IOC  Discharge Exam: Blood pressure 128/86, pulse 72, temperature (!) 97.5 F (36.4 C), temperature source Oral, resp. rate 18, height 5\' 5"  (1.651 m), weight 97.5 kg, last menstrual period 05/30/2020, SpO2 95 %. HEENT - clear Neck - soft Chest - clear bilaterally Cor - RRR Abd - soft without distension; wounds dry and intact with Dermabond; minimal tenderness  Disposition: Home  Discharge Instructions    Diet - low sodium heart healthy   Complete by: As directed    Discharge instructions   Complete by: As directed    [facility]  LAPAROSCOPIC SURGERY:  POST-OP INSTRUCTIONS  Always review your discharge instruction sheet given to you by the facility where your surgery was performed.  A prescription for pain medication may be given to you upon discharge.  Take your pain medication as prescribed.  If narcotic pain medicine is not needed, then you may take acetaminophen (Tylenol) or ibuprofen (Advil) as needed.  Take your usually prescribed medications unless otherwise directed.  If you need a refill on your pain medication, please contact your pharmacy.  They will contact our office to request authorization. Prescriptions will not be filled after 5 P.M. or on weekends.  You should follow a light  diet the first few days after arrival home, such as soup and crackers or toast.  Be sure to include plenty of fluids daily.  Most patients will experience some swelling and bruising in the area of the incisions.  Ice packs will help.  Swelling and bruising can take several days to resolve.   It is common to experience some constipation after surgery.  Increasing fluid intake and taking a stool softener (such as Colace) will usually help or prevent this problem from occurring.  A mild laxative (Milk of Magnesia or Miralax) should be taken according to package instructions if there has been no bowel movement after 48 hours.  You will likely have Dermabond (topical glue) over your incisions.  This seals the incisions and allows you to bathe and shower at any time after your surgery.  Glue should remain in place for up to 10 days.  It may be removed after 10 days by pealing off the Dermabond material or using Vaseline or naval jelly to remove.  If you have steri-strips over your incisions, you may remove the gauze bandage on the second day after surgery, and you may shower at that time.  Leave your steri-strips (small skin tapes) in place directly over the incision.  These strips should remain on the skin for 5-7 days and then be removed.  You may get them wet in the shower and pat them dry.  Any sutures or staples will be removed at the office during your follow-up visit.  ACTIVITIES:  You may resume regular (light) daily activities beginning the next day - such as daily self-care, walking, climbing stairs - gradually increasing activities as tolerated.  You may have sexual intercourse when it is comfortable.  Refrain from any heavy lifting or straining until approved by your doctor.  You may drive when you are no longer taking prescription pain medication, when you can comfortably wear a seatbelt, and when you can safely maneuver your car and apply brakes.  You should see your doctor in the office for  a follow-up appointment approximately 2-3 weeks after your surgery.  Make sure that you call for this appointment within a day or two after you arrive home to insure a convenient appointment time.  WHEN TO CALL YOUR DOCTOR: Fever over 101.0 Inability to urinate Continued bleeding from incision Increased pain, redness, or drainage from the incision Increasing abdominal pain  The clinic staff is available to answer your questions during regular business hours.  Please don't hesitate to call and ask to speak to one of the nurses for clinical concerns.  If you have a medical emergency, go to the nearest emergency room or call 911.  A surgeon from [facility] is always on call for the hospital.  Velora Heckler, MD, [facility] Office: [phone] Toll Free:  [phone] FAX [phone]  Website: [email or web address]   Increase activity slowly   Complete by: As directed    No dressing needed   Complete by: As directed        Follow-up Information    [facility] Go on 07/06/2020.   Specialty: General Surgery Why: Your appointment is 07/06/20 at 1:30 pm Please arrive 30 minutes prior to your appointment to check in and fill out paperwork. Bring photo ID and insurance information. Contact information: [address] [place] [address] [phone]              Velora Heckler, MD, [facility] Office: [phone]   Signed: Darnell Level 06/15/2020, 8:05 AM

## 2020-06-16 ENCOUNTER — Other Ambulatory Visit: Payer: BC Managed Care – PPO

## 2020-08-08 ENCOUNTER — Other Ambulatory Visit: Payer: Self-pay

## 2020-08-08 ENCOUNTER — Encounter: Payer: Self-pay | Admitting: Surgical

## 2020-08-08 ENCOUNTER — Ambulatory Visit (INDEPENDENT_AMBULATORY_CARE_PROVIDER_SITE_OTHER): Payer: BC Managed Care – PPO | Admitting: Surgical

## 2020-08-08 VITALS — BP 119/77 | HR 92 | Temp 98.3°F

## 2020-08-08 DIAGNOSIS — R21 Rash and other nonspecific skin eruption: Secondary | ICD-10-CM | POA: Diagnosis not present

## 2020-08-08 DIAGNOSIS — Z9889 Other specified postprocedural states: Secondary | ICD-10-CM | POA: Diagnosis not present

## 2020-08-08 NOTE — Progress Notes (Addendum)
   Subjective:     Patient ID: Sierra Flores, female    DOB: 03/06/1976, 44 y.o.   MRN: [id number]  Chief Complaint  Patient presents with   Follow-up    HPI: Patient is a 44 year old female here for follow-up after bilateral breast reduction with Dr. Arita Miss on 04/17/2020.  She was last seen in the office on 05/26/2020.  She is doing well overall, but reports that she noticed a rash on her left breast.  She was concerned for infection and that is why she is here for evaluation.  She reports that the rash has not been bothering her other than the appearance of it.  She does not have any itching.  She did start to use a new scar cream called bio oil, but reports no other recent changes.  The rash is localized to the lateral aspect of her left breast.  Review of Systems  Constitutional: Negative.   Skin: Positive for rash. Negative for wound.     Objective:   Vital Signs BP 119/77 (BP Location: Left Arm, Patient Position: Sitting, Cuff Size: Normal)   Pulse 92   Temp 98.3 F (36.8 C) (Oral)   SpO2 98%  Vital Signs and Nursing Note Reviewed Chaperone present Physical Exam Constitutional:      General: She is not in acute distress.    Appearance: Normal appearance. She is not ill-appearing.  HENT:     Head: Normocephalic and atraumatic.  Chest:       Comments: Bilateral breast incisions well-healed, bilateral NAC's are viable with good color. Skin:    General: Skin is warm and dry.     Findings: Rash present. Rash is macular and papular.  Neurological:     General: No focal deficit present.     Mental Status: She is alert.  Psychiatric:        Mood and Affect: Mood normal.        Behavior: Behavior normal.     Assessment/Plan:     ICD-10-CM   1. S/P bilateral breast reduction  Z98.890   2. Rash  R21     Recommend stopping the use of the bio oil scar treatment.  Unsure etiology of the left lateral breast rash.  It does not appear infectious in etiology, possible  contact dermatitis from the new bio oil.  There is no wounds noted.  I recommend applying hydrocortisone 1% as needed.  If the rash is not bothersome to the patient, can allow to resolve on its own.  If she develops some itching, recommend antihistamines.  I discussed with the patient that if does not resolve in the next 2 weeks she may need to reach out to her PCP for further evaluation.  I do not feel as if it is associated with her bilateral breast reduction surgery.  It can be associated with the scar cream that she is started to use, stopping use should result in improvement.  We did discuss use of other scar creams including Mederma and skinuva.  Provided patient with recommendations on how to use.  Recommend calling with questions or concerns. Follow up as needed.   Kermit Balo Araceli Arango, PA-C 08/08/2020, 2:44 PM

## 2020-08-16 DIAGNOSIS — E039 Hypothyroidism, unspecified: Secondary | ICD-10-CM | POA: Diagnosis not present

## 2020-08-16 DIAGNOSIS — E559 Vitamin D deficiency, unspecified: Secondary | ICD-10-CM | POA: Diagnosis not present

## 2020-08-16 DIAGNOSIS — E785 Hyperlipidemia, unspecified: Secondary | ICD-10-CM | POA: Diagnosis not present

## 2020-08-16 DIAGNOSIS — G43909 Migraine, unspecified, not intractable, without status migrainosus: Secondary | ICD-10-CM | POA: Diagnosis not present

## 2020-09-25 DIAGNOSIS — F4321 Adjustment disorder with depressed mood: Secondary | ICD-10-CM | POA: Diagnosis not present

## 2020-10-03 DIAGNOSIS — F419 Anxiety disorder, unspecified: Secondary | ICD-10-CM | POA: Diagnosis not present

## 2020-10-03 DIAGNOSIS — E039 Hypothyroidism, unspecified: Secondary | ICD-10-CM | POA: Diagnosis not present

## 2020-10-03 DIAGNOSIS — G43909 Migraine, unspecified, not intractable, without status migrainosus: Secondary | ICD-10-CM | POA: Diagnosis not present

## 2020-11-02 DIAGNOSIS — Z1231 Encounter for screening mammogram for malignant neoplasm of breast: Secondary | ICD-10-CM | POA: Diagnosis not present

## 2020-11-13 DIAGNOSIS — N6312 Unspecified lump in the right breast, upper inner quadrant: Secondary | ICD-10-CM | POA: Diagnosis not present

## 2020-11-13 DIAGNOSIS — N6011 Diffuse cystic mastopathy of right breast: Secondary | ICD-10-CM | POA: Diagnosis not present

## 2021-02-05 ENCOUNTER — Other Ambulatory Visit: Payer: Self-pay | Admitting: Physician Assistant

## 2021-02-08 ENCOUNTER — Other Ambulatory Visit (HOSPITAL_COMMUNITY)
Admission: RE | Admit: 2021-02-08 | Discharge: 2021-02-08 | Disposition: A | Discharge status: Home / Self Care | Payer: Managed Care, Other (non HMO) | Source: Ambulatory Visit | Attending: Physician Assistant | Admitting: Physician Assistant

## 2021-02-08 DIAGNOSIS — Z0000 Encounter for general adult medical examination without abnormal findings: Secondary | ICD-10-CM | POA: Diagnosis present

## 2021-02-09 LAB — CYTOLOGY - PAP
Comment: NEGATIVE
Diagnosis: NEGATIVE
High risk HPV: NEGATIVE

## 2021-12-05 IMAGING — CT CT ABD-PELV W/ CM
2 of 5 series · 17 of 46 positions shown, 19 images · IV contrast (OMNIPAQUE 300)
Comparison: None.

CLINICAL DATA: Abdominal pain

EXAM:
CT ABDOMEN AND PELVIS WITH CONTRAST
TECHNIQUE: Multidetector CT imaging of the abdomen and pelvis was performed
using the standard protocol following bolus administration of
intravenous contrast.
CONTRAST:  100mL OMNIPAQUE IOHEXOL 300 MG/ML  SOLN

[Series 2: axial st · axial · 0.72mm/px · z∈[-354,+36]mm · 14 of 90 slices shown, 16 images]
[im 6/90  soft-tissue]
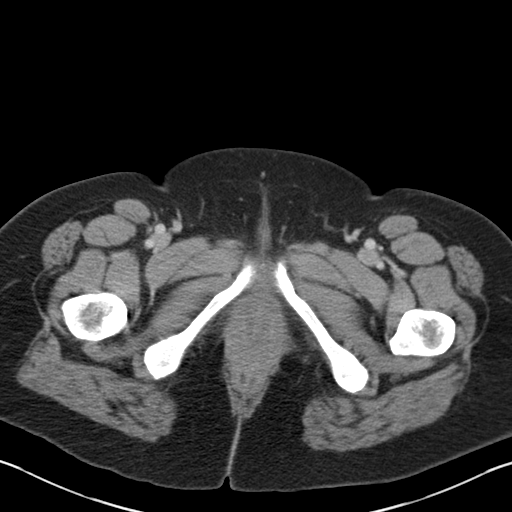
[im 6/90  bone]
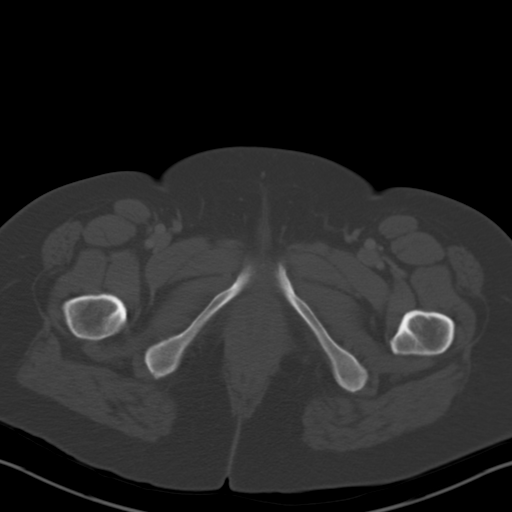
[im 11/90  soft-tissue]
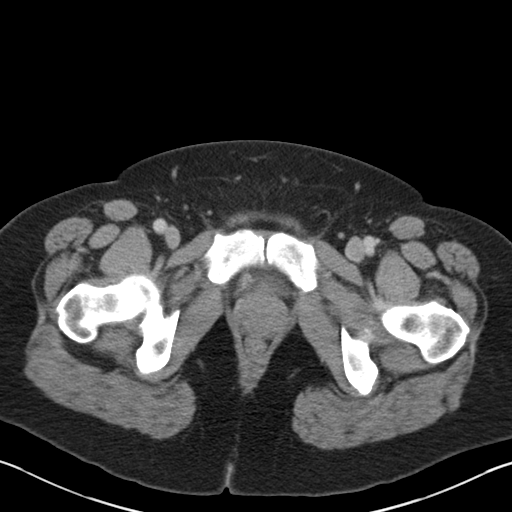
[im 16/90  soft-tissue]
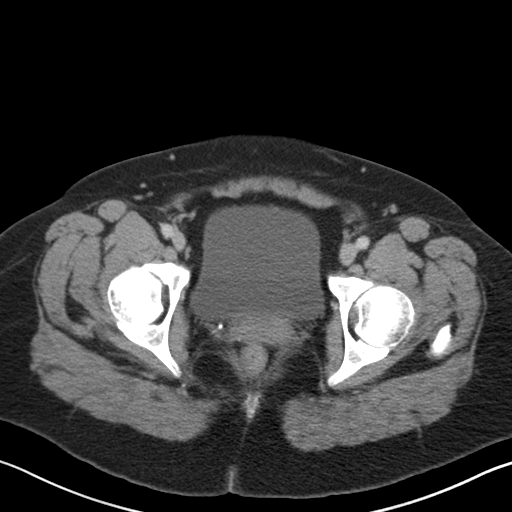
[im 27/90  soft-tissue]
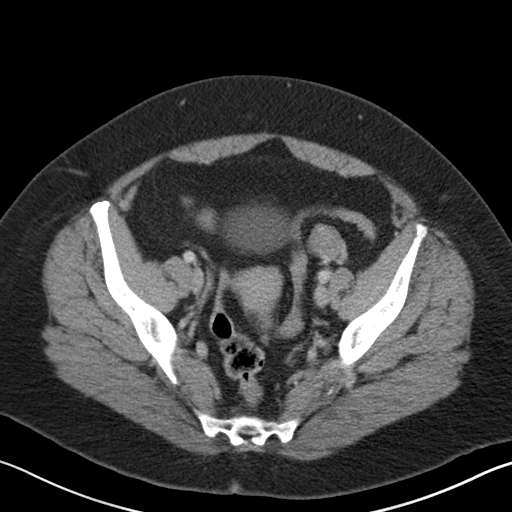
[im 32/90  soft-tissue]
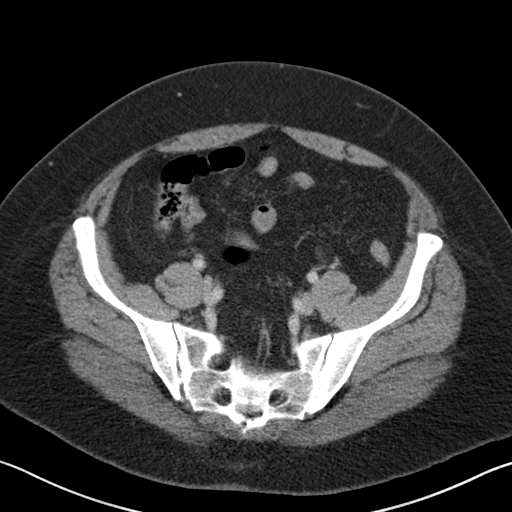
[im 37/90  soft-tissue]
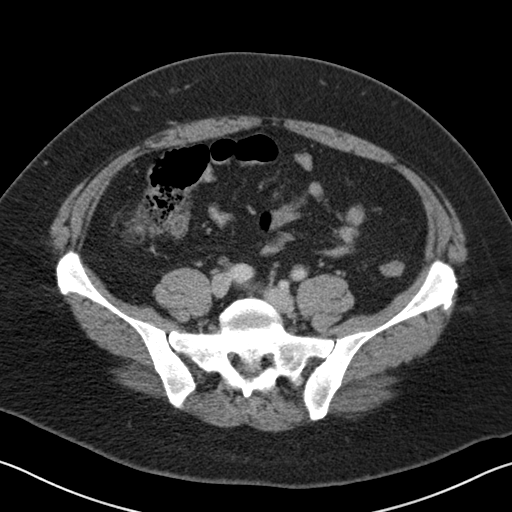
[im 42/90  soft-tissue]
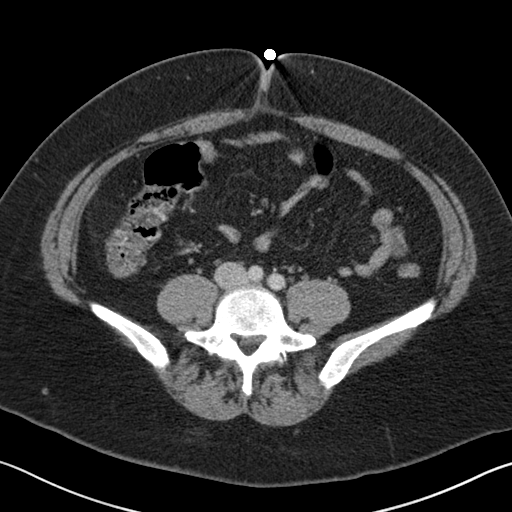
[im 48/90  soft-tissue]
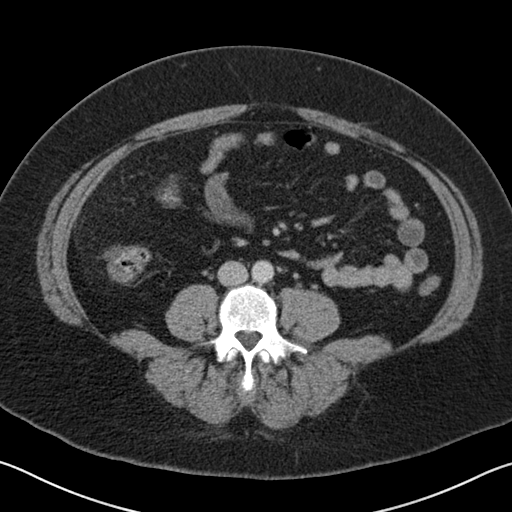
[im 53/90  soft-tissue]
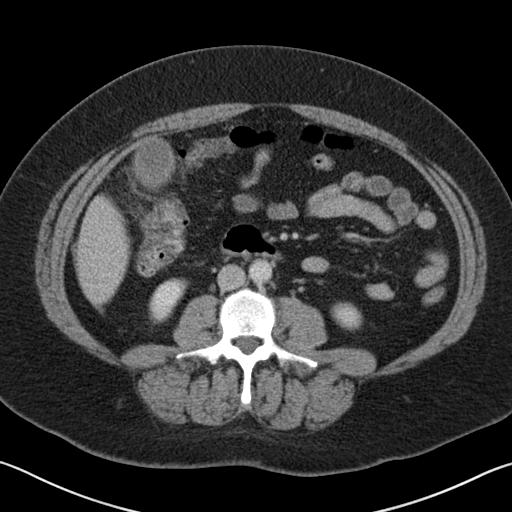
[im 53/90  bone]
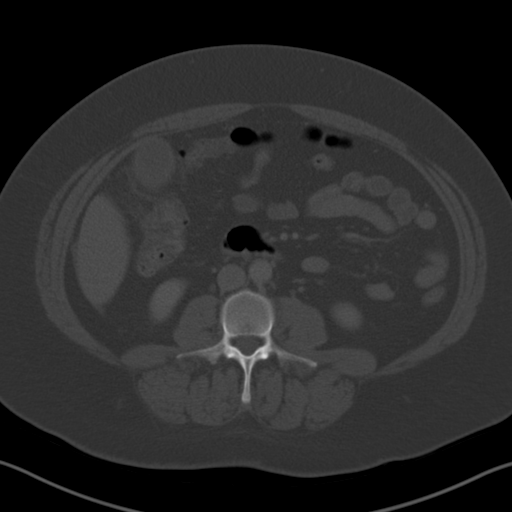
[im 58/90  soft-tissue]
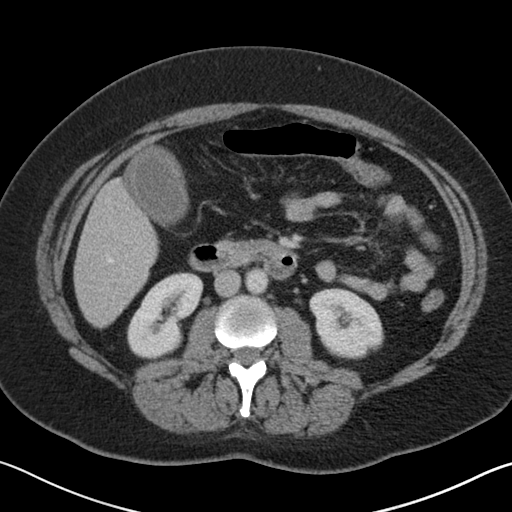
[im 69/90  soft-tissue]
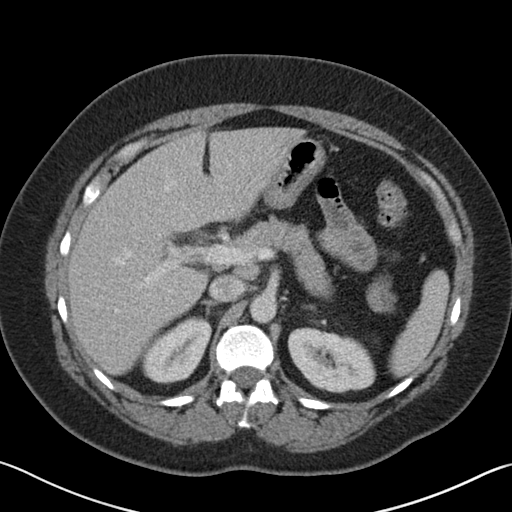
[im 74/90  soft-tissue]
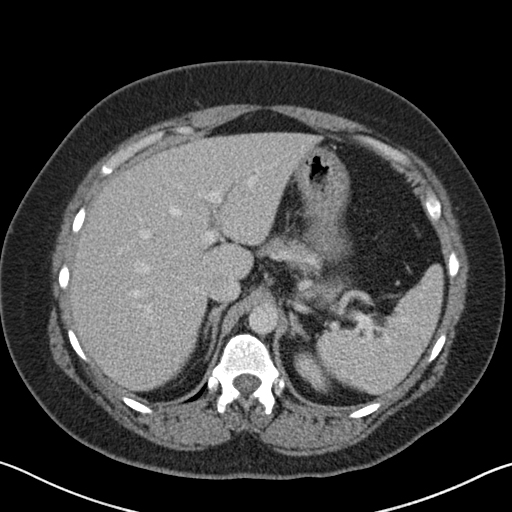
[im 79/90  soft-tissue]
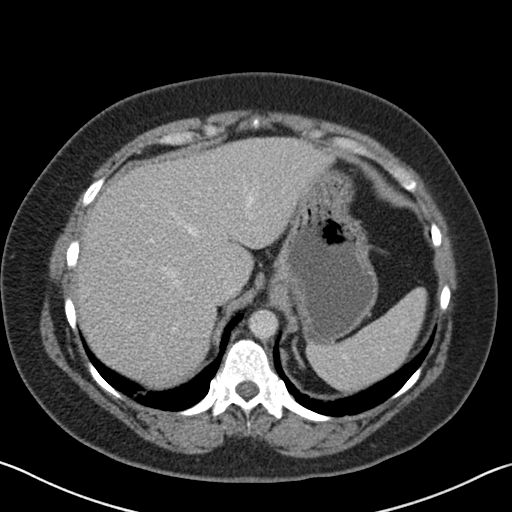
[im 84/90  soft-tissue]
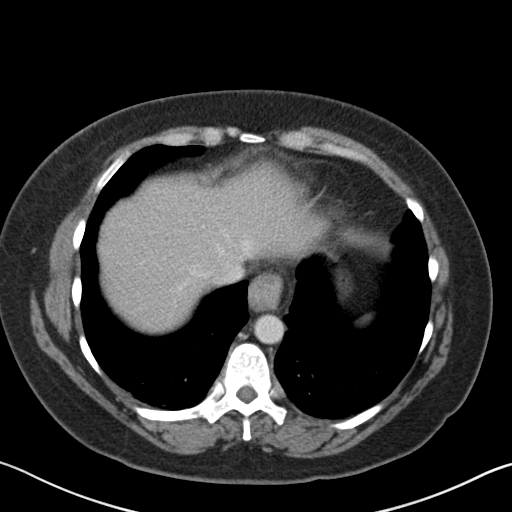

[Series 4: coronal st · coronal · 0.74mm/px · 3 of 154 slices shown]
[im 52/154  soft-tissue]
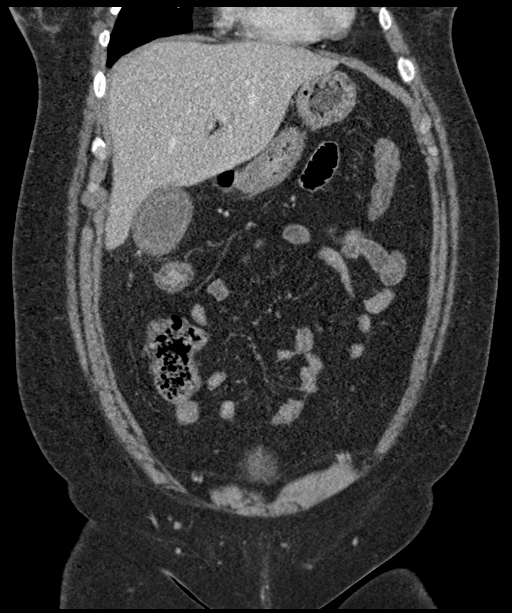
[im 69/154  soft-tissue]
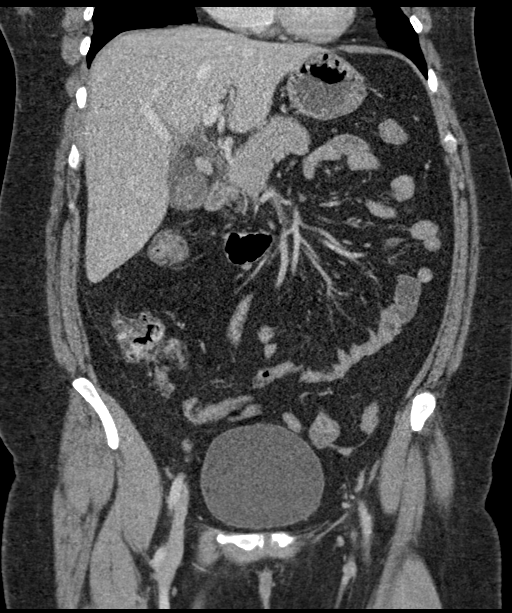
[im 86/154  soft-tissue]
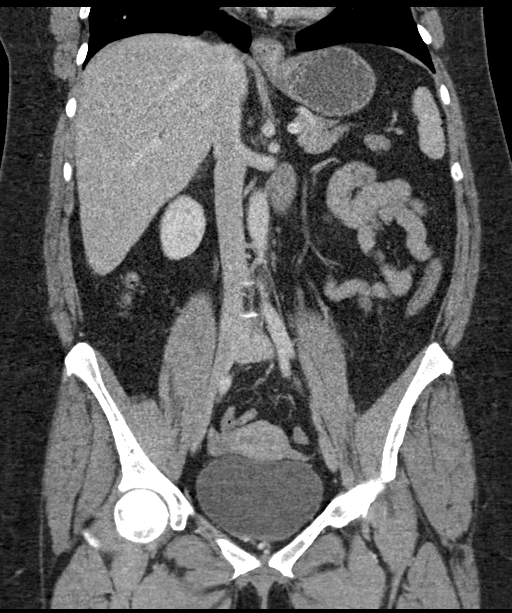

[17 of 46 positions shown; findings below may reference images not displayed]

FINDINGS: LOWER CHEST: Normal.

HEPATOBILIARY: Normal hepatic contours. No intra- or extrahepatic
biliary dilatation. Edematous gallbladder wall with cholelithiasis.

PANCREAS: Normal pancreas. No ductal dilatation or peripancreatic
fluid collection.

SPLEEN: Normal.

ADRENALS/URINARY TRACT: The adrenal glands are normal. No
hydronephrosis, nephroureterolithiasis or solid renal mass. The
urinary bladder is normal for degree of distention

STOMACH/BOWEL: There is no hiatal hernia. Normal duodenal course and
caliber. No small bowel dilatation or inflammation. No focal colonic
abnormality. Normal appendix.

VASCULAR/LYMPHATIC: Normal course and caliber of the major abdominal
vessels. No abdominal or pelvic lymphadenopathy.

REPRODUCTIVE: Normal uterus. No adnexal mass.

MUSCULOSKELETAL. No bony spinal canal stenosis or focal osseous
abnormality.

OTHER: None.
IMPRESSION: Edematous gallbladder wall with cholelithiasis, consistent with
acute cholecystitis.

## 2021-12-05 IMAGING — RF DG CHOLANGIOGRAM OPERATIVE
1 series · 4 of 4 positions shown · non-contrast
Comparison: CT 06/14/2020

CLINICAL DATA: 43-year-old female with a history abdominal pain and
cholelithiasis

EXAM:
INTRAOPERATIVE CHOLANGIOGRAM
TECHNIQUE: Cholangiographic images from the C-arm fluoroscopic device were
submitted for interpretation post-operatively. Please see the
procedural report for the amount of contrast and the fluoroscopy
time utilized.

[Series 1: run · 4 of 73 frames shown]
[frame 11/73]
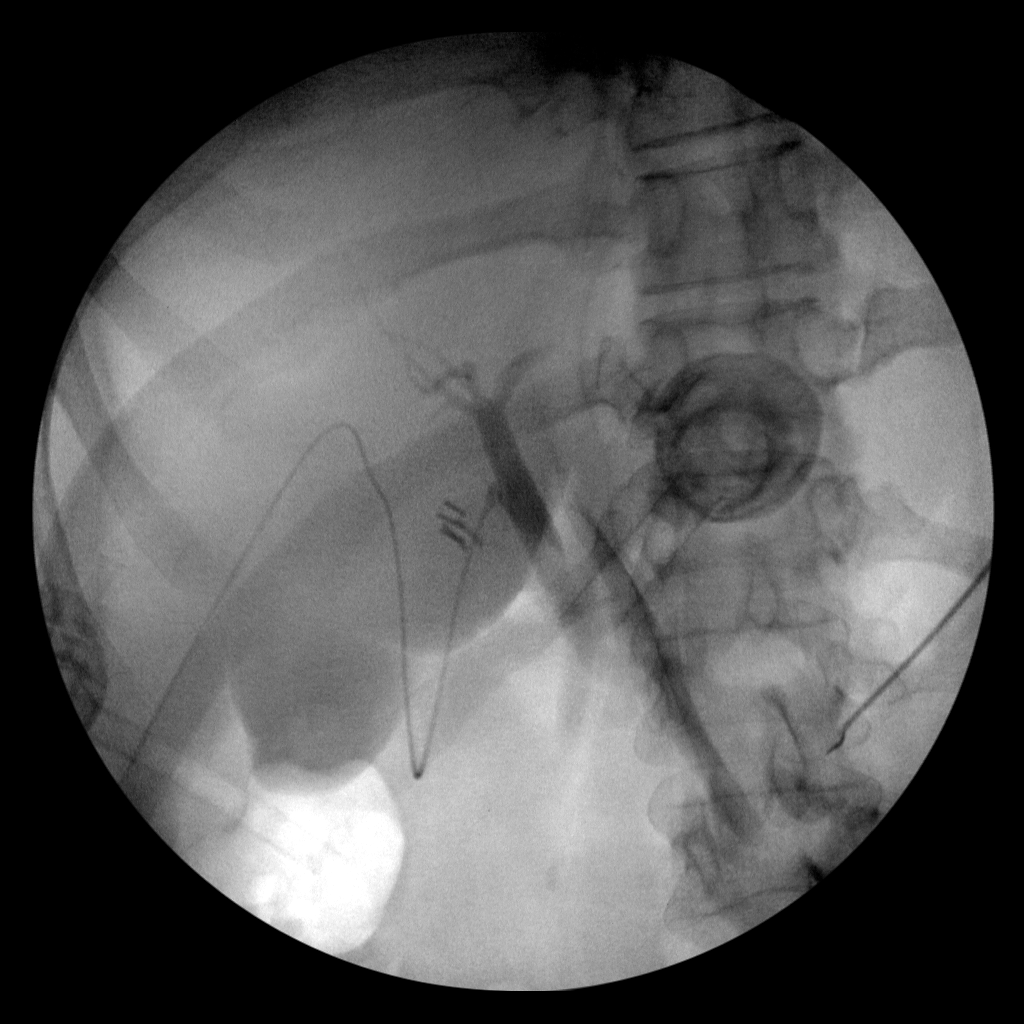
[frame 37/73]
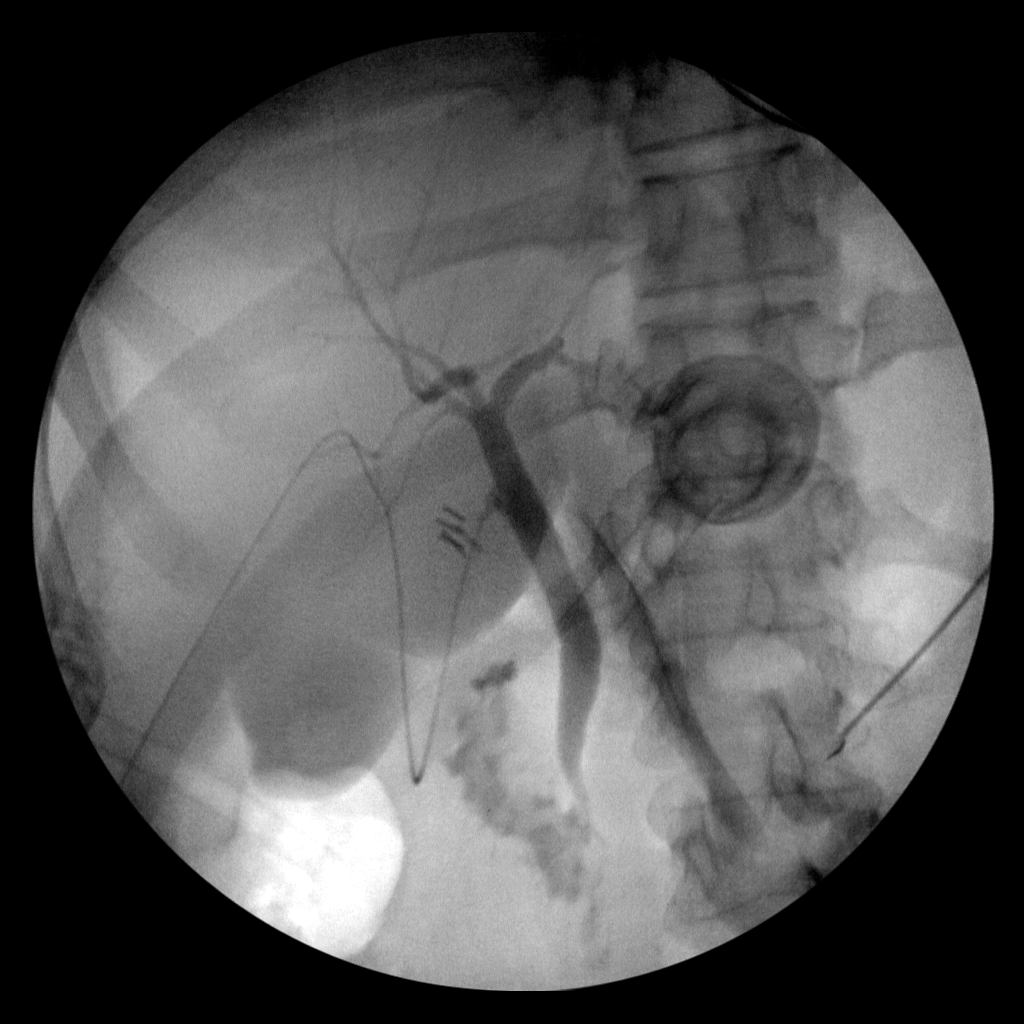
[frame 63/73]
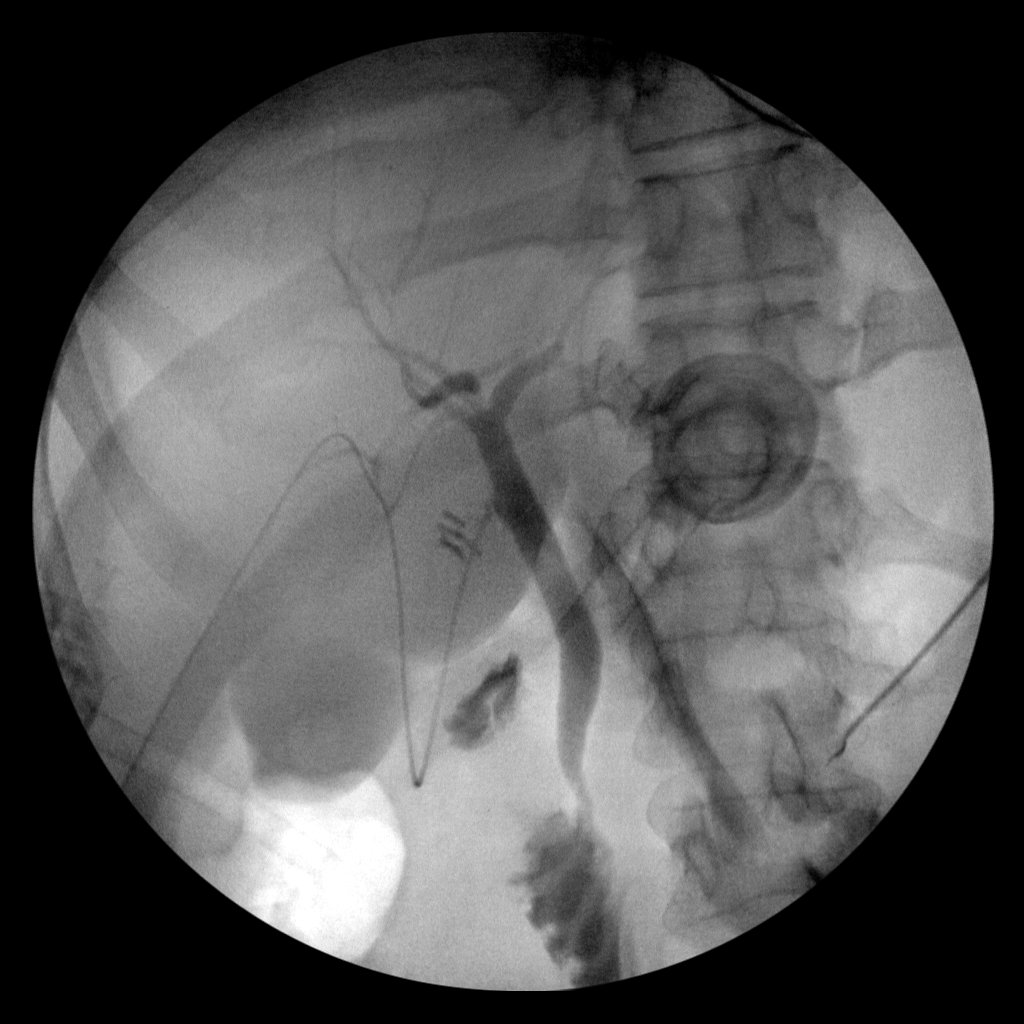
[frame 72/73]
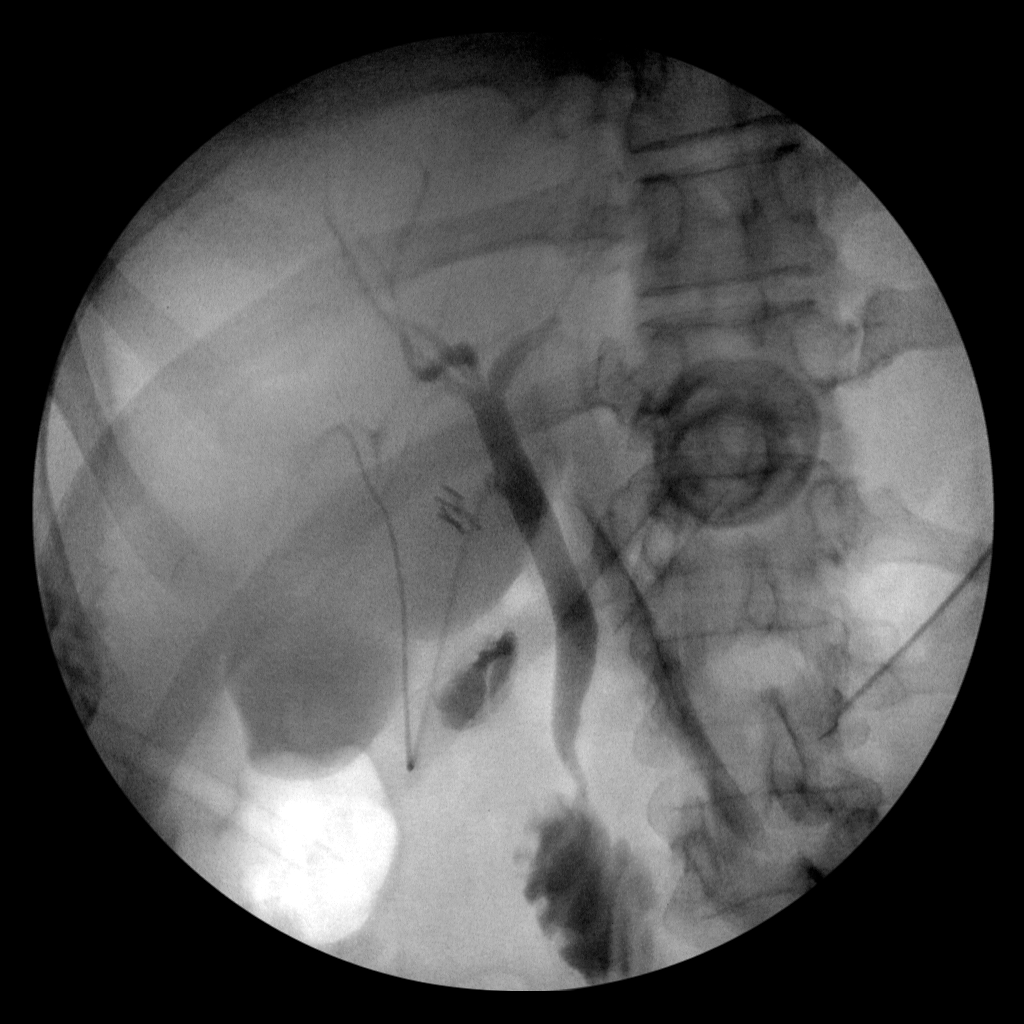

[4 of 4 positions shown; findings below may reference images not displayed]

FINDINGS: Surgical instruments project over the upper abdomen.

There is cannulation of the cystic duct/gallbladder neck, with
antegrade infusion of contrast. Caliber of the extrahepatic ductal
system within normal limits.

No definite filling defect within the extrahepatic ducts identified.

Free flow of contrast across the ampulla.
IMPRESSION: Intraoperative cholangiogram demonstrates extrahepatic biliary ducts
of unremarkable caliber, with no definite filling defects
identified. Free flow of contrast across the ampulla.

Please refer to the dictated operative report for full details of
intraoperative findings and procedure

## 2024-07-26 ENCOUNTER — Other Ambulatory Visit: Payer: Self-pay

## 2024-07-26 ENCOUNTER — Emergency Department (HOSPITAL_COMMUNITY)

## 2024-07-26 ENCOUNTER — Emergency Department (HOSPITAL_COMMUNITY)
Admission: EM | Admit: 2024-07-26 | Discharge: 2024-07-26 | Disposition: A | Discharge status: Home / Self Care | Attending: Emergency Medicine | Admitting: Emergency Medicine

## 2024-07-26 DIAGNOSIS — S8261XA Displaced fracture of lateral malleolus of right fibula, initial encounter for closed fracture: Secondary | ICD-10-CM

## 2024-07-26 MED ORDER — OXYCODONE-ACETAMINOPHEN 5-325 MG PO TABS
1.0000 | ORAL_TABLET | Freq: Once | ORAL | Status: AC
  Administered 2024-07-26: 1 via ORAL
  Filled 2024-07-26: qty 1

## 2024-07-26 MED ORDER — HYDROCODONE-ACETAMINOPHEN 5-325 MG PO TABS: 1.0000 | ORAL_TABLET | Freq: Four times a day (QID) | ORAL | 0 refills | Status: AC | PRN

## 2024-07-26 NOTE — Discharge Instructions (Addendum)
 Please use Tylenol  or ibuprofen for pain.  You may use 600 mg ibuprofen every 6 hours or 1000 mg of Tylenol  every 6 hours.  You may choose to alternate between the 2.  This would be most effective.  Not to exceed 4 g of Tylenol  within 24 hours.  Not to exceed 3200 mg ibuprofen 24 hours.  You can use the stronger narcotic pain medication in place of Tylenol  for severe break through pain.  If you take the narcotic pain medication that we prescribed recommend that you also take a laxative such as MiraLAX  or Dulcolax every day that you take the narcotic pain medicine, and drink plenty of fluids, 50 to 64 ounces to prevent any constipation.  As we discussed this injury is a weightbearing as tolerated injury, because of the left fibula is not the major weightbearing bone in the ankle you are not going to worsen the fracture if you do place weight on it as long as you are wearing the cam walker boot, however due to the pain it is understandable that you may have some increased pain with attempted ambulation and so you can use the crutches until the pain starts to improve.  When you are not walking on the affected leg I recommend rest, ice, and elevation of the leg.  You can remove the cam walker boot while bathing and changing, but should otherwise be wearing it at all times.

## 2024-07-26 NOTE — ED Provider Notes (Signed)
 [place] EMERGENCY DEPARTMENT AT [facility] Provider Note   CSN: [id number] Arrival date & time: 07/26/24  1944     Patient presents with: Ankle Injury   Sierra Flores is a 48 y.o. female with overall noncontributory past medical history presents with concern for rolling right ankle after stepping on ice just prior to arrival.  She did fall but did not strike her head, did not lose consciousness.  She reports she has not been able to bear weight on the affected ankle.  Nothing for pain prior to arrival.    Ankle Injury       Prior to Admission medications   Medication Sig Start Date End Date Taking? Authorizing Provider  HYDROcodone -acetaminophen  (NORCO/VICODIN) 5-325 MG tablet Take 1 tablet by mouth every 6 (six) hours as needed. 07/26/24  Yes Raahi Korber H, PA-C  escitalopram  (LEXAPRO ) 10 MG tablet Take 10 mg by mouth daily. 10/04/19   [provider]  levothyroxine (SYNTHROID) 50 MCG tablet Take 50 mcg by mouth daily. 05/29/20   [provider]  oxyCODONE  (OXY IR/ROXICODONE ) 5 MG immediate release tablet Take 1-2 tablets (5-10 mg total) by mouth every 4 (four) hours as needed for moderate pain. 06/15/20   Eletha Boas, MD  rizatriptan (MAXALT) 10 MG tablet Take 10 mg by mouth 2 (two) times daily as needed. Patient not taking: Reported on 08/08/2020 07/19/19   [provider]    Allergies: Sumatriptan    Review of Systems  All other systems reviewed and are negative.   Updated Vital Signs BP 125/87   Pulse 80   Temp 98 F (36.7 C)   Resp 16   LMP 07/20/2024   SpO2 100%   Physical Exam Vitals and nursing note reviewed.  Constitutional:      General: She is not in acute distress.    Appearance: Normal appearance.  HENT:     Head: Normocephalic and atraumatic.  Eyes:     General:        Right eye: No discharge.        Left eye: No discharge.  Cardiovascular:     Rate and Rhythm: Normal rate and regular rhythm.      Pulses: Normal pulses.     Comments: Dp, pt pulses 2+ in affected RLE Pulmonary:     Effort: Pulmonary effort is normal. No respiratory distress.  Musculoskeletal:        General: Swelling present. No deformity.     Comments: Significant swelling of the right ankle especially on the lateral aspect, decreased range of motion secondary to pain, but no obvious deformity initially appreciated.  Skin:    General: Skin is warm and dry.  Neurological:     Mental Status: She is alert and oriented to person, place, and time.  Psychiatric:        Mood and Affect: Mood normal.        Behavior: Behavior normal.     (all labs ordered are listed, but only abnormal results are displayed) Labs Reviewed - No data to display   EKG: None  Radiology: DG Ankle Complete Right Result Date: 07/26/2024 EXAM: 3 OR MORE VIEW(S) XRAY OF THE RIGHT ANKLE 07/26/2024 08:23:00 PM CLINICAL HISTORY: Right ankle pain COMPARISON: None available. FINDINGS: BONES AND JOINTS: Acute displaced Oblique lateral malleolar fracture. SOFT TISSUES: Moderate anterior and lateral soft tissue edema. IMPRESSION: 1. Acute displaced oblique lateral malleolar fracture. Electronically signed by: Morgane Naveau MD 07/26/2024 08:29 PM EST RP Workstation: [id number]  Procedures   Medications Ordered in the ED  oxyCODONE -acetaminophen  (PERCOCET/ROXICET) 5-325 MG per tablet 1 tablet (1 tablet Oral Given 07/26/24 2034)                                    Medical Decision Making Amount and/or Complexity of Data Reviewed Labs: ordered. Radiology: ordered.  Risk Prescription drug management.   This patient is a 48 y.o. female who presents to the ED for concern of ankle pain.   Differential diagnoses prior to evaluation: Fracture, dislocation, sprain vs other soft tissue injury  Past Medical History / Social History / Additional history: Chart reviewed. Pertinent results include: overall noncontributory  Physical  Exam: Physical exam performed. The pertinent findings include:  Significant swelling of the right ankle especially on the lateral aspect, decreased range of motion secondary to pain, but no obvious deformity initially appreciated.  Dp, pt pulses 2+ in affected RLE  Imaging: I independently interpreted imaging including plain film radiograph of the right ankle which shows distal fibular head fracture mildly displaced. I agree with the radiologist interpretation.  Medications / Treatment: Percocet for pain, patient placed in cam walker boot, provided crutches but encouraged that the injury is a weightbearing as tolerated injury, encouraged close follow-up with orthopedics   Disposition: After consideration of the diagnostic results and the patients response to treatment, I feel that patient stable for discharge with plan as above, orthopedic follow-up, RICE, short course of pain medication provided.   emergency department workup does not suggest an emergent condition requiring admission or immediate intervention beyond what has been performed at this time. The plan is: as above. The patient is safe for discharge and has been instructed to return immediately for worsening symptoms, change in symptoms or any other concerns.   Final diagnoses:  Closed low lateral malleolus fracture, right, initial encounter    ED Discharge Orders          Ordered    HYDROcodone -acetaminophen  (NORCO/VICODIN) 5-325 MG tablet  Every 6 hours PRN        07/26/24 2052               Aubrii Sharpless H, PA-C 07/26/24 2100    Francesca Elsie CROME, MD 07/26/24 [phone]

## 2024-07-26 NOTE — ED Triage Notes (Signed)
 Patient states she rolled her right ankle after stepping on a ice. Patient states she fell backwards, denies LOC , denies hitting her head.  Noted right ankle edema.
